# Patient Record
Sex: Female | Born: 2000 | Race: Black or African American | Hispanic: No | Marital: Single | Smoking: Never smoker
Health system: Southern US, Community
[De-identification: ages and names within clinical notes are randomized; demographics above are authoritative.]

## PROBLEM LIST (undated history)

## (undated) DIAGNOSIS — L409 Psoriasis, unspecified: Secondary | ICD-10-CM

---

## 2021-01-26 ENCOUNTER — Encounter (HOSPITAL_COMMUNITY): Payer: Self-pay

## 2021-01-26 ENCOUNTER — Other Ambulatory Visit: Payer: Self-pay

## 2021-01-26 ENCOUNTER — Emergency Department (HOSPITAL_COMMUNITY): Payer: Federal, State, Local not specified - PPO

## 2021-01-26 ENCOUNTER — Emergency Department (HOSPITAL_COMMUNITY)
Admission: EM | Admit: 2021-01-26 | Discharge: 2021-01-26 | Disposition: A | Discharge status: Home / Self Care | Payer: Federal, State, Local not specified - PPO | Attending: Emergency Medicine | Admitting: Emergency Medicine

## 2021-01-26 DIAGNOSIS — M791 Myalgia, unspecified site: Secondary | ICD-10-CM | POA: Diagnosis not present

## 2021-01-26 DIAGNOSIS — R569 Unspecified convulsions: Secondary | ICD-10-CM | POA: Diagnosis present

## 2021-01-26 DIAGNOSIS — R202 Paresthesia of skin: Secondary | ICD-10-CM | POA: Diagnosis not present

## 2021-01-26 DIAGNOSIS — R42 Dizziness and giddiness: Secondary | ICD-10-CM | POA: Diagnosis not present

## 2021-01-26 DIAGNOSIS — R519 Headache, unspecified: Secondary | ICD-10-CM | POA: Insufficient documentation

## 2021-01-26 HISTORY — DX: Psoriasis, unspecified: L40.9

## 2021-01-26 LAB — CBC WITH DIFFERENTIAL/PLATELET
Abs Immature Granulocytes: 0.04 10*3/uL (ref 0.00–0.07)
Basophils Absolute: 0.1 10*3/uL (ref 0.0–0.1)
Basophils Relative: 1 %
Eosinophils Absolute: 0.2 10*3/uL (ref 0.0–0.5)
Eosinophils Relative: 2 %
HCT: 38 % (ref 36.0–46.0)
Hemoglobin: 12.4 g/dL (ref 12.0–15.0)
Immature Granulocytes: 0 %
Lymphocytes Relative: 27 %
Lymphs Abs: 2.9 10*3/uL (ref 0.7–4.0)
MCH: 31.3 pg (ref 26.0–34.0)
MCHC: 32.6 g/dL (ref 30.0–36.0)
MCV: 96 fL (ref 80.0–100.0)
Monocytes Absolute: 1.2 10*3/uL — ABNORMAL HIGH (ref 0.1–1.0)
Monocytes Relative: 11 %
Neutro Abs: 6.6 10*3/uL (ref 1.7–7.7)
Neutrophils Relative %: 59 %
Platelets: 349 10*3/uL (ref 150–400)
RBC: 3.96 MIL/uL (ref 3.87–5.11)
RDW: 11.9 % (ref 11.5–15.5)
WBC: 10.9 10*3/uL — ABNORMAL HIGH (ref 4.0–10.5)
nRBC: 0 % (ref 0.0–0.2)

## 2021-01-26 LAB — COMPREHENSIVE METABOLIC PANEL
ALT: 6 U/L (ref 0–44)
AST: 16 U/L (ref 15–41)
Albumin: 4.1 g/dL (ref 3.5–5.0)
Alkaline Phosphatase: 34 U/L — ABNORMAL LOW (ref 38–126)
Anion gap: 8 (ref 5–15)
BUN: 10 mg/dL (ref 6–20)
CO2: 24 mmol/L (ref 22–32)
Calcium: 9.1 mg/dL (ref 8.9–10.3)
Chloride: 107 mmol/L (ref 98–111)
Creatinine, Ser: 0.79 mg/dL (ref 0.44–1.00)
GFR, Estimated: >60 mL/min (ref >60)
Glucose, Bld: 99 mg/dL (ref 70–99)
Potassium: 3.9 mmol/L (ref 3.5–5.1)
Sodium: 139 mmol/L (ref 135–145)
Total Bilirubin: 0.3 mg/dL (ref 0.3–1.2)
Total Protein: 7.4 g/dL (ref 6.5–8.1)

## 2021-01-26 LAB — RAPID URINE DRUG SCREEN, HOSP PERFORMED
Amphetamines: NOT DETECTED
Barbiturates: NOT DETECTED
Benzodiazepines: NOT DETECTED
Cocaine: NOT DETECTED
Opiates: NOT DETECTED
Tetrahydrocannabinol: NOT DETECTED

## 2021-01-26 LAB — I-STAT BETA HCG BLOOD, ED (MC, WL, AP ONLY): I-stat hCG, quantitative: <5 m[IU]/mL (ref <5)

## 2021-01-26 LAB — CBG MONITORING, ED: Glucose-Capillary: 112 mg/dL — ABNORMAL HIGH (ref 70–99)

## 2021-01-26 NOTE — Discharge Instructions (Addendum)
Do NOT drive until cleared by neurology. Do not do work on ladders, swim in a pool or soak in a tub or participate in activities that could endanger yourself or others. Follow up with neurology, a referral has been made to [facility]. Return to the ER for worsening or concerning symptoms.

## 2021-01-26 NOTE — ED Triage Notes (Signed)
Pt states she's been having seizures x 2 weeks- states "maybe 4 total". Pt states she thinks she's had seizures in the past, but never seen MD about issue. Pt states she hit her head during episode 2 wks ago, foaming at the mouth, seizures last 1-2 minutes. Does not take keppra or any seizure meds. Pt a& o x4

## 2021-01-26 NOTE — ED Provider Notes (Signed)
[facility]-EMERGENCY DEPT Provider Note   CSN: [id number] Arrival date & time: 01/26/21  1656     History Chief Complaint  Patient presents with  . Seizures    Sara Ford is a 20 y.o. female.  20 year old female present with complaint of possible seizures. States 2 weeks ago, trying to get the covers out from under her in bed in her dorm, fell out of the bed, approximately 4 feet high, hit occipital head on an outlet box protruding from the wall, + LOC, reports foam on her mouth when she woke up, no loss of bowel or bladder control.. Event not witnessed by anyone else. Guesses occurred around 4pm and lasted a few minutes. Headache and body aches afterwards, is in [employer] and participated in PT and assumed body aches were normal.  Yesterday, blacked out and was told that she was shaking and foaming at the mouth for 2 minutes, then same event happened again 20 minutes later. Reports feeling dizzy with right side body tingling (diffuse). Ongoing headaches. Events yesterday happened while sitting on a couch, did not fall/no further injuries. No loss of bowel or bladder control. Reports bit tongue yesterday.  History of erythrodermic psoriasis, does not require medications, has IBS (medications made skin condition worse so stopped all meds).  Maternal great grandmother with seizure history.  Denies drug or alcohol use.         Past Medical History:  Diagnosis Date  . Psoriasis     There are no problems to display for this patient.   History reviewed. No pertinent surgical history.   OB History   No obstetric history on file.     No family history on file.  Social History   Tobacco Use  . Smoking status: Never Smoker  . Smokeless tobacco: Never Used  Substance Use Topics  . Alcohol use: Not Currently  . Drug use: Not Currently    Home Medications Prior to Admission medications   Not on File    Allergies    Penicillins  Review of Systems    Review of Systems  Constitutional: Negative for chills and fever.  Eyes: Negative for visual disturbance.  Respiratory: Negative for cough.   Cardiovascular: Negative for chest pain.  Gastrointestinal: Negative for abdominal distention, nausea and vomiting.  Musculoskeletal: Negative for back pain and neck pain.  Skin: Negative for rash and wound.  Allergic/Immunologic: Negative for immunocompromised state.  Neurological: Positive for headaches. Negative for weakness.  Psychiatric/Behavioral: Negative for confusion.  All other systems reviewed and are negative.   Physical Exam Updated Vital Signs BP 127/70   Pulse 82   Temp 98.5 F (36.9 C) (Oral)   Resp 16   Ht 5\' 1"  (1.549 m)   Wt 61.2 kg   LMP 01/12/2021   SpO2 100%   BMI 25.51 kg/m   Physical Exam Vitals and nursing note reviewed.  Constitutional:      General: She is not in acute distress.    Appearance: She is well-developed and well-nourished. She is not diaphoretic.  HENT:     Head: Normocephalic and atraumatic.     Mouth/Throat:     Mouth: Mucous membranes are moist.  Eyes:     Extraocular Movements: Extraocular movements intact.     Pupils: Pupils are equal, round, and reactive to light.  Cardiovascular:     Rate and Rhythm: Normal rate and regular rhythm.     Pulses: Normal pulses.     Heart  sounds: Normal heart sounds.  Pulmonary:     Effort: Pulmonary effort is normal.     Breath sounds: Normal breath sounds.  Abdominal:     Palpations: Abdomen is soft.     Tenderness: There is no abdominal tenderness.  Musculoskeletal:     Cervical back: Normal range of motion and neck supple. No tenderness.  Skin:    General: Skin is warm and dry.     Findings: No erythema or rash.  Neurological:     Mental Status: She is alert and oriented to person, place, and time.     Cranial Nerves: No cranial nerve deficit.     Sensory: No sensory deficit.     Motor: No weakness.     Gait: Gait normal.  Psychiatric:         Mood and Affect: Mood and affect normal.        Behavior: Behavior normal.     ED Results / Procedures / Treatments   Labs (all labs ordered are listed, but only abnormal results are displayed) Labs Reviewed  COMPREHENSIVE METABOLIC PANEL - Abnormal; Notable for the following components:      Result Value   Alkaline Phosphatase 34 (*)    All other components within normal limits  CBC WITH DIFFERENTIAL/PLATELET - Abnormal; Notable for the following components:   WBC 10.9 (*)    Monocytes Absolute 1.2 (*)    All other components within normal limits  CBG MONITORING, ED - Abnormal; Notable for the following components:   Glucose-Capillary 112 (*)    All other components within normal limits  RAPID URINE DRUG SCREEN, HOSP PERFORMED  I-STAT BETA HCG BLOOD, ED ([facility], [facility], [facility] ONLY)    EKG None  Radiology CT Head Wo Contrast  Result Date: 01/26/2021 CLINICAL DATA:  Seizure, acute, history of trauma EXAM: CT HEAD WITHOUT CONTRAST TECHNIQUE: Contiguous axial images were obtained from the base of the skull through the vertex without intravenous contrast. COMPARISON:  None. FINDINGS: Brain: No evidence of acute infarction, hemorrhage, hydrocephalus, extra-axial collection or mass lesion/mass effect. Vascular: Negative for hyperdense vessel Skull: Negative Sinuses/Orbits: Negative Other: None IMPRESSION: Negative CT head Electronically Signed   By: Marlan Palau M.D.   On: 01/26/2021 20:40    Procedures Procedures   Medications Ordered in ED Medications - No data to display  ED Course  I have reviewed the triage vital signs and the nursing notes.  Pertinent labs & imaging results that were available during my care of the patient were reviewed by me and considered in my medical decision making (see chart for details).  Clinical Course as of 01/26/21 2209  Mon Jan 26, 2021  158 20 year old female presents for evaluation after possible seizure as above.  Exam is unremarkable.  CT  head is normal, labs without significant findings including CBC, CMP.  hCG is negative. Patient is referred to neurology for follow-up, advised not to drive, swim in a pool or operate machinery produce pain activities that could potentially put her or others in danger. [LM]    Clinical Course User Index [LM] Alden Hipp   MDM Rules/Calculators/A&P                          Final Clinical Impression(s) / ED Diagnoses Final diagnoses:  Seizure-like activity [facility])    Rx / DC Orders ED Discharge Orders         Ordered    Ambulatory referral to  Neurology       Comments: An appointment is requested in approximately: 1 week   01/26/21 2203           Jeannie Fend, PA-C 01/26/21 2209    Linwood Dibbles, MD 01/27/21 [phone]

## 2021-03-02 ENCOUNTER — Ambulatory Visit: Payer: Federal, State, Local not specified - PPO | Admitting: Diagnostic Neuroimaging

## 2021-05-26 ENCOUNTER — Ambulatory Visit (HOSPITAL_COMMUNITY)
Admission: EM | Admit: 2021-05-26 | Discharge: 2021-05-26 | Disposition: A | Discharge status: Home / Self Care | Payer: Federal, State, Local not specified - PPO | Attending: Physician Assistant | Admitting: Physician Assistant

## 2021-05-26 ENCOUNTER — Encounter (HOSPITAL_COMMUNITY): Payer: Self-pay | Admitting: Emergency Medicine

## 2021-05-26 ENCOUNTER — Other Ambulatory Visit: Payer: Self-pay

## 2021-05-26 DIAGNOSIS — R110 Nausea: Secondary | ICD-10-CM

## 2021-05-26 DIAGNOSIS — R1030 Lower abdominal pain, unspecified: Secondary | ICD-10-CM | POA: Diagnosis present

## 2021-05-26 DIAGNOSIS — Z113 Encounter for screening for infections with a predominantly sexual mode of transmission: Secondary | ICD-10-CM | POA: Insufficient documentation

## 2021-05-26 LAB — POCT URINALYSIS DIPSTICK, ED / UC
Bilirubin Urine: NEGATIVE
Glucose, UA: NEGATIVE mg/dL
Hgb urine dipstick: NEGATIVE
Ketones, ur: NEGATIVE mg/dL
Leukocytes,Ua: NEGATIVE
Nitrite: NEGATIVE
Protein, ur: NEGATIVE mg/dL
Specific Gravity, Urine: 1.025 (ref 1.005–1.030)
Urobilinogen, UA: 0.2 mg/dL (ref 0.0–1.0)
pH: 5.5 (ref 5.0–8.0)

## 2021-05-26 LAB — POC URINE PREG, ED: Preg Test, Ur: NEGATIVE

## 2021-05-26 MED ORDER — ONDANSETRON 4 MG PO TBDP
ORAL_TABLET | ORAL | Status: AC
  Filled 2021-05-26: qty 1

## 2021-05-26 MED ORDER — FAMOTIDINE 40 MG PO TABS: 40.0000 mg | ORAL_TABLET | Freq: Every day | ORAL | 0 refills | Status: DC

## 2021-05-26 MED ORDER — ONDANSETRON 4 MG PO TBDP
4.0000 mg | ORAL_TABLET | Freq: Once | ORAL | Status: AC
  Administered 2021-05-26: 4 mg via ORAL

## 2021-05-26 NOTE — ED Triage Notes (Signed)
Lower abdominal pain and nausea for 3 weeks. History of irregualr menstruals, is sexually active without use of contraception. Denies dysuria / vaginal DC

## 2021-05-26 NOTE — ED Provider Notes (Signed)
[facility]    CSN: [id number] Arrival date & time: 05/26/21  1557      History   Chief Complaint Chief Complaint  Patient presents with   Abdominal Pain   Nausea    HPI Sara Ford is a 20 y.o. female.   Patient presents today with a several week history of intermittent abdominal pain and nausea.  She reports pain is generally localized to her lower abdomen.  Pain is currently rated 3 on a 0-10 pain scale but at its worst is rated an 8, described as cramping, localized to lower abdomen without radiation, no aggravating relieving factors identified.  She has seen a GI specialist in the past and was diagnosed with IBS but reports symptoms resolved several years ago and she has not had any recurrent problems with this condition.  Denies previous abdominal surgery.  She denies any pelvic pain, vaginal discharge, urinary symptoms, fever, vomiting, melena, hematochezia.  She denies any changes to diet or routine.  She is unsure if symptoms are related to oral intake and often has bouts of nausea without identifiable trigger.  She denies history of GERD and has not tried any acid reducing medications.   Past Medical History:  Diagnosis Date   Psoriasis     There are no problems to display for this patient.   History reviewed. No pertinent surgical history.  OB History   No obstetric history on file.      Home Medications    Prior to Admission medications   Medication Sig Start Date End Date Taking? Authorizing Provider  famotidine (PEPCID) 40 MG tablet Take 1 tablet (40 mg total) by mouth daily. 05/26/21  Yes Pratham Cassatt, Noberto Retort, PA-C    Family History History reviewed. No pertinent family history.  Social History Social History   Tobacco Use   Smoking status: Never   Smokeless tobacco: Never  Substance Use Topics   Alcohol use: Not Currently   Drug use: Not Currently     Allergies   Contrast media [iodinated diagnostic agents] and  Penicillins   Review of Systems Review of Systems  Constitutional:  Negative for activity change, appetite change, fatigue and fever.  Respiratory:  Negative for cough and shortness of breath.   Cardiovascular:  Negative for chest pain.  Gastrointestinal:  Positive for abdominal pain and nausea. Negative for diarrhea and vomiting.  Genitourinary:  Negative for dysuria, frequency, urgency, vaginal bleeding, vaginal discharge and vaginal pain.  Musculoskeletal:  Negative for arthralgias and myalgias.  Neurological:  Negative for dizziness, light-headedness and headaches.    Physical Exam Triage Vital Signs ED Triage Vitals  Enc Vitals Group     BP 05/26/21 1705 131/78     Pulse Rate 05/26/21 1705 91     Resp 05/26/21 1705 16     Temp 05/26/21 1705 99 F (37.2 C)     Temp Source 05/26/21 1705 Oral     SpO2 05/26/21 1705 100 %     Weight --      Height --      Head Circumference --      Peak Flow --      Pain Score 05/26/21 1707 3     Pain Loc --      Pain Edu? --      Excl. in GC? --    No data found.  Updated Vital Signs BP 131/78   Pulse 91   Temp 99 F (37.2 C) (Oral)   Resp 16  SpO2 100%   Visual Acuity Right Eye Distance:   Left Eye Distance:   Bilateral Distance:    Right Eye Near:   Left Eye Near:    Bilateral Near:     Physical Exam Vitals reviewed.  Constitutional:      General: She is awake. She is not in acute distress.    Appearance: Normal appearance. She is normal weight. She is not ill-appearing.     Comments: Very pleasant female appears stated age in no acute distress sitting comfortably on exam table  HENT:     Head: Normocephalic and atraumatic.     Mouth/Throat:     Mouth: Mucous membranes are moist.  Cardiovascular:     Rate and Rhythm: Normal rate and regular rhythm.     Heart sounds: Normal heart sounds, S1 normal and S2 normal. No murmur heard. Pulmonary:     Effort: Pulmonary effort is normal.     Breath sounds: Normal breath  sounds. No wheezing, rhonchi or rales.     Comments: Clear to auscultation bilaterally Abdominal:     General: Bowel sounds are normal.     Palpations: Abdomen is soft.     Tenderness: There is abdominal tenderness in the right lower quadrant, suprapubic area and left lower quadrant. There is no right CVA tenderness, left CVA tenderness, guarding or rebound. Negative signs include Rovsing's sign, McBurney's sign and psoas sign.     Comments: Mild tenderness palpation throughout lower abdomen.  No CVA tenderness.  No evidence of acute abdomen on physical exam.  Genitourinary:    Comments: Exam deferred Psychiatric:        Behavior: Behavior is cooperative.     UC Treatments / Results  Labs (all labs ordered are listed, but only abnormal results are displayed) Labs Reviewed  POC URINE PREG, ED  POCT URINALYSIS DIPSTICK, ED / UC  CERVICOVAGINAL ANCILLARY ONLY    EKG   Radiology No results found.  Procedures Procedures (including critical care time)  Medications Ordered in UC Medications  ondansetron (ZOFRAN-ODT) disintegrating tablet 4 mg (4 mg Oral Given 05/26/21 1742)    Initial Impression / Assessment and Plan / UC Course  I have reviewed the triage vital signs and the nursing notes.  Pertinent labs & imaging results that were available during my care of the patient were reviewed by me and considered in my medical decision making (see chart for details).      UA negative in clinic.  Urine pregnancy test was negative.  Vital signs and physical exam reassuring today; no negation for emergent evaluation or imaging.  Patient was given Zofran in clinic with minimal improvement of symptoms.  Offered prescription for different nausea medication such as Phenergan the patient declined this.  We will start Pepcid in the hopes this to provide some relief of symptoms.  She was encouraged to eat a bland diet and drink plenty of fluid.  Discussed that if symptoms persist or worsen she  would need to go to the emergency room for imaging as this is not something that is available in urgent care.  She was given contact information for GI specialist and instructed to follow-up if symptoms persist but do not worsen.  Discussed alarm symptoms that warrant emergent evaluation.  Strict return precautions given to which patient expressed understanding.  Final Clinical Impressions(s) / UC Diagnoses   Final diagnoses:  Lower abdominal pain  Nausea  Routine screening for STI (sexually transmitted infection)     Discharge Instructions  Make sure you are drinking plenty of fluid and eat a very bland diet.  Begin Pepcid at night to help coat your stomach and decrease acid.  If you develop any worsening symptoms including severe abdominal pain, persistent nausea/vomiting, fever, blood in your stool you need to be reevaluated immediately.  If your abdominal pain worsens you need to go to the emergency room as we are unable to obtain imaging in urgent care.  Follow-up with gastroenterology if symptoms do not improve.     ED Prescriptions     Medication Sig Dispense Auth. Provider   famotidine (PEPCID) 40 MG tablet Take 1 tablet (40 mg total) by mouth daily. 30 tablet Hali Balgobin, Noberto Retort, PA-C      PDMP not reviewed this encounter.   Jeani Hawking, PA-C 05/26/21 1757

## 2021-05-26 NOTE — Discharge Instructions (Addendum)
Make sure you are drinking plenty of fluid and eat a very bland diet.  Begin Pepcid at night to help coat your stomach and decrease acid.  If you develop any worsening symptoms including severe abdominal pain, persistent nausea/vomiting, fever, blood in your stool you need to be reevaluated immediately.  If your abdominal pain worsens you need to go to the emergency room as we are unable to obtain imaging in urgent care.  Follow-up with gastroenterology if symptoms do not improve.

## 2021-05-27 ENCOUNTER — Telehealth (HOSPITAL_COMMUNITY): Payer: Self-pay | Admitting: Emergency Medicine

## 2021-05-27 LAB — CERVICOVAGINAL ANCILLARY ONLY
Bacterial Vaginitis (gardnerella): POSITIVE — AB
Candida Glabrata: NEGATIVE
Candida Vaginitis: NEGATIVE
Chlamydia: NEGATIVE
Comment: NEGATIVE
Comment: NEGATIVE
Comment: NEGATIVE
Comment: NEGATIVE
Comment: NEGATIVE
Comment: NORMAL
Neisseria Gonorrhea: NEGATIVE
Trichomonas: NEGATIVE

## 2021-05-27 MED ORDER — METRONIDAZOLE 500 MG PO TABS: 500.0000 mg | ORAL_TABLET | Freq: Two times a day (BID) | ORAL | 0 refills | Status: DC

## 2021-08-18 ENCOUNTER — Encounter (HOSPITAL_COMMUNITY): Payer: Self-pay

## 2021-08-18 ENCOUNTER — Ambulatory Visit (HOSPITAL_COMMUNITY)
Admission: EM | Admit: 2021-08-18 | Discharge: 2021-08-18 | Disposition: A | Discharge status: Home / Self Care | Payer: BC Managed Care – PPO | Attending: Student | Admitting: Student

## 2021-08-18 ENCOUNTER — Other Ambulatory Visit: Payer: Self-pay

## 2021-08-18 DIAGNOSIS — Z3200 Encounter for pregnancy test, result unknown: Secondary | ICD-10-CM | POA: Diagnosis present

## 2021-08-18 LAB — HCG, QUANTITATIVE, PREGNANCY: hCG, Beta Chain, Quant, S: 1 m[IU]/mL (ref <5)

## 2021-08-18 NOTE — ED Provider Notes (Signed)
[facility]    CSN: [id number] Arrival date & time: 08/18/21  1231      History   Chief Complaint Chief Complaint  Patient presents with   Possible Pregnancy    HPI Tal Neer is a 20 y.o. female presenting with concern for pregnancy.  Medical history noncontributory.  Patient states she is sexually active.  Endorses nausea without vomiting, breast tenderness x3 weeks following last period. Denies vaginal or urinary symptoms.   HPI  Past Medical History:  Diagnosis Date   Psoriasis     There are no problems to display for this patient.   History reviewed. No pertinent surgical history.  OB History   No obstetric history on file.      Home Medications    Prior to Admission medications   Medication Sig Start Date End Date Taking? Authorizing Provider  famotidine (PEPCID) 40 MG tablet Take 1 tablet (40 mg total) by mouth daily. 05/26/21   Raspet, Noberto Retort, PA-C  metroNIDAZOLE (FLAGYL) 500 MG tablet Take 1 tablet (500 mg total) by mouth 2 (two) times daily. 05/27/21   Lamptey, Britta Mccreedy, MD    Family History Family History  Problem Relation Age of Onset   Colon cancer Mother     Social History Social History   Tobacco Use   Smoking status: Never   Smokeless tobacco: Never  Substance Use Topics   Alcohol use: Not Currently   Drug use: Not Currently     Allergies   Contrast media [iodinated diagnostic agents] and Penicillins   Review of Systems Review of Systems  All other systems reviewed and are negative.   Physical Exam Triage Vital Signs ED Triage Vitals  Enc Vitals Group     BP 08/18/21 1344 127/75     Pulse Rate 08/18/21 1344 90     Resp 08/18/21 1344 17     Temp 08/18/21 1344 98.9 F (37.2 C)     Temp Source 08/18/21 1344 Oral     SpO2 08/18/21 1344 96 %     Weight --      Height --      Head Circumference --      Peak Flow --      Pain Score 08/18/21 1341 0     Pain Loc --      Pain Edu? --      Excl. in GC? --     No data found.  Updated Vital Signs BP 127/75 (BP Location: Right Arm)   Pulse 90   Temp 98.9 F (37.2 C) (Oral)   Resp 17   LMP 07/28/2021 Comment: Pt has PCOS  SpO2 96%   Visual Acuity Right Eye Distance:   Left Eye Distance:   Bilateral Distance:    Right Eye Near:   Left Eye Near:    Bilateral Near:     Physical Exam Vitals reviewed.  Constitutional:      General: She is not in acute distress.    Appearance: Normal appearance. She is not ill-appearing or diaphoretic.  HENT:     Head: Normocephalic and atraumatic.  Cardiovascular:     Rate and Rhythm: Normal rate and regular rhythm.     Heart sounds: Normal heart sounds.  Pulmonary:     Effort: Pulmonary effort is normal.     Breath sounds: Normal breath sounds.  Skin:    General: Skin is warm.  Neurological:     General: No focal deficit present.  Mental Status: She is alert and oriented to person, place, and time.  Psychiatric:        Mood and Affect: Mood normal.        Behavior: Behavior normal.        Thought Content: Thought content normal.        Judgment: Judgment normal.     UC Treatments / Results  Labs (all labs ordered are listed, but only abnormal results are displayed) Labs Reviewed  HCG, QUANTITATIVE, PREGNANCY    EKG   Radiology No results found.  Procedures Procedures (including critical care time)  Medications Ordered in UC Medications - No data to display  Initial Impression / Assessment and Plan / UC Course  I have reviewed the triage vital signs and the nursing notes.  Pertinent labs & imaging results that were available during my care of the patient were reviewed by me and considered in my medical decision making (see chart for details).     This patient is a very pleasant 20 y.o. year old female presenting with concern for pregnancy- 3 positive and 3 negative home pregnancy tests. Quant Hcg sent. ED return precautions discussed. Patient verbalizes understanding  and agreement.  .   Final Clinical Impressions(s) / UC Diagnoses   Final diagnoses:  Encounter for pregnancy test, result unknown     Discharge Instructions      -We'll call within about 1 day if positive. Results will also go to your mychart.     ED Prescriptions   None    PDMP not reviewed this encounter.   Rhys Martini, PA-C 08/18/21 [phone]

## 2021-08-18 NOTE — ED Triage Notes (Signed)
Pt is here wanting a blood pregnancy test after having 3 positive test done today & 3 negative test done today as well. Pt states she had a menstrual 3 weeks ago.

## 2021-08-18 NOTE — Discharge Instructions (Addendum)
-  We'll call within about 1 day if positive. Results will also go to your mychart.

## 2021-11-09 ENCOUNTER — Emergency Department (HOSPITAL_COMMUNITY)
Admission: EM | Admit: 2021-11-09 | Discharge: 2021-11-10 | Disposition: A | Discharge status: Home / Self Care | Payer: BC Managed Care – PPO | Attending: Emergency Medicine | Admitting: Emergency Medicine

## 2021-11-09 ENCOUNTER — Other Ambulatory Visit: Payer: Self-pay

## 2021-11-09 ENCOUNTER — Encounter (HOSPITAL_COMMUNITY): Payer: Self-pay | Admitting: Emergency Medicine

## 2021-11-09 ENCOUNTER — Emergency Department (HOSPITAL_COMMUNITY): Payer: BC Managed Care – PPO

## 2021-11-09 DIAGNOSIS — N9489 Other specified conditions associated with female genital organs and menstrual cycle: Secondary | ICD-10-CM | POA: Insufficient documentation

## 2021-11-09 DIAGNOSIS — M79674 Pain in right toe(s): Secondary | ICD-10-CM | POA: Diagnosis not present

## 2021-11-09 DIAGNOSIS — R1012 Left upper quadrant pain: Secondary | ICD-10-CM | POA: Diagnosis not present

## 2021-11-09 DIAGNOSIS — R109 Unspecified abdominal pain: Secondary | ICD-10-CM | POA: Diagnosis present

## 2021-11-09 LAB — URINALYSIS, ROUTINE W REFLEX MICROSCOPIC
Bilirubin Urine: NEGATIVE
Glucose, UA: NEGATIVE mg/dL
Hgb urine dipstick: NEGATIVE
Ketones, ur: NEGATIVE mg/dL
Leukocytes,Ua: NEGATIVE
Nitrite: NEGATIVE
Protein, ur: NEGATIVE mg/dL
Specific Gravity, Urine: 1.025 (ref 1.005–1.030)
pH: 5.5 (ref 5.0–8.0)

## 2021-11-09 LAB — CBC WITH DIFFERENTIAL/PLATELET
Abs Immature Granulocytes: 0.02 10*3/uL (ref 0.00–0.07)
Basophils Absolute: 0 10*3/uL (ref 0.0–0.1)
Basophils Relative: 1 %
Eosinophils Absolute: 0.2 10*3/uL (ref 0.0–0.5)
Eosinophils Relative: 3 %
HCT: 39.2 % (ref 36.0–46.0)
Hemoglobin: 12.8 g/dL (ref 12.0–15.0)
Immature Granulocytes: 0 %
Lymphocytes Relative: 32 %
Lymphs Abs: 2.5 10*3/uL (ref 0.7–4.0)
MCH: 30.8 pg (ref 26.0–34.0)
MCHC: 32.7 g/dL (ref 30.0–36.0)
MCV: 94.5 fL (ref 80.0–100.0)
Monocytes Absolute: 1 10*3/uL (ref 0.1–1.0)
Monocytes Relative: 12 %
Neutro Abs: 4.2 10*3/uL (ref 1.7–7.7)
Neutrophils Relative %: 52 %
Platelets: 317 10*3/uL (ref 150–400)
RBC: 4.15 MIL/uL (ref 3.87–5.11)
RDW: 11.8 % (ref 11.5–15.5)
WBC: 8 10*3/uL (ref 4.0–10.5)
nRBC: 0 % (ref 0.0–0.2)

## 2021-11-09 LAB — COMPREHENSIVE METABOLIC PANEL
ALT: 10 U/L (ref 0–44)
AST: 17 U/L (ref 15–41)
Albumin: 3.8 g/dL (ref 3.5–5.0)
Alkaline Phosphatase: 64 U/L (ref 38–126)
Anion gap: 6 (ref 5–15)
BUN: 11 mg/dL (ref 6–20)
CO2: 26 mmol/L (ref 22–32)
Calcium: 9.2 mg/dL (ref 8.9–10.3)
Chloride: 107 mmol/L (ref 98–111)
Creatinine, Ser: 0.8 mg/dL (ref 0.44–1.00)
GFR, Estimated: >60 mL/min (ref >60)
Glucose, Bld: 80 mg/dL (ref 70–99)
Potassium: 3.9 mmol/L (ref 3.5–5.1)
Sodium: 139 mmol/L (ref 135–145)
Total Bilirubin: 0.2 mg/dL — ABNORMAL LOW (ref 0.3–1.2)
Total Protein: 7 g/dL (ref 6.5–8.1)

## 2021-11-09 LAB — I-STAT BETA HCG BLOOD, ED (MC, WL, AP ONLY): I-stat hCG, quantitative: <5 m[IU]/mL (ref <5)

## 2021-11-09 LAB — LIPASE, BLOOD: Lipase: 37 U/L (ref 11–51)

## 2021-11-09 NOTE — ED Triage Notes (Signed)
Patient with abdominal pain for almost 3 months.  With nausea and vomiting.  Patient also states that she has right 4th toe pain, states no injury but does have a bruise on it.

## 2021-11-09 NOTE — ED Provider Notes (Signed)
Emergency Medicine Provider Triage Evaluation Note  Sara Ford , a 20 y.o. female  was evaluated in triage.  Pt complains of abdominal pain has been ongoing for months.  Was worse over the last 24 hours prompted her arrival.  Reports associated intermittent fevers, shortness of breath, nausea and vomiting, irregular periods.  Took a pregnancy test which is negative.  Also complains of right fourth toe pain.  No injury.  Review of Systems  Positive:  Negative: See above   Physical Exam  BP 127/85 (BP Location: Right Arm)   Pulse 88   Temp 99 F (37.2 C) (Oral)   Resp 16   SpO2 99%  Gen:   Awake, no distress   Resp:  Normal effort  MSK:   Moves extremities without difficulty  Other:  Generalized abdominal tenderness  Medical Decision Making  Medically screening exam initiated at 7:15 PM.  Appropriate orders placed.  Sara Ford was informed that the remainder of the evaluation will be completed by another provider, this initial triage assessment does not replace that evaluation, and the importance of remaining in the ED until their evaluation is complete.     Sara Ford, [place] 11/09/21 1915    Ford, Sara Brim, MD 11/09/21 [phone]

## 2021-11-10 DIAGNOSIS — R1012 Left upper quadrant pain: Secondary | ICD-10-CM | POA: Diagnosis not present

## 2021-11-10 MED ORDER — KETOROLAC TROMETHAMINE 30 MG/ML IJ SOLN: 30.0000 mg | Freq: Once | INTRAMUSCULAR | Status: DC

## 2021-11-10 MED ORDER — KETOROLAC TROMETHAMINE 60 MG/2ML IM SOLN
30.0000 mg | Freq: Once | INTRAMUSCULAR | Status: AC
  Administered 2021-11-10: 30 mg via INTRAMUSCULAR

## 2021-11-10 MED ORDER — KETOROLAC TROMETHAMINE 60 MG/2ML IM SOLN
60.0000 mg | Freq: Once | INTRAMUSCULAR | Status: DC
  Filled 2021-11-10: qty 2

## 2021-11-10 MED ORDER — OMEPRAZOLE 20 MG PO CPDR: 20.0000 mg | DELAYED_RELEASE_CAPSULE | Freq: Every day | ORAL | 0 refills | Status: DC

## 2021-11-10 NOTE — ED Notes (Signed)
Discharge instructions and prescription reviewed and explained, pt verbalized understanding. °

## 2021-11-10 NOTE — ED Provider Notes (Signed)
[facility] EMERGENCY DEPARTMENT Provider Note   CSN: [id number] Arrival date & time: 11/09/21  1730     History Chief Complaint  Patient presents with   Abdominal Pain   Toe Pain    Sara Ford is a 20 y.o. female.  HPI     This is a 20 year old female with a history of psoriasis who presents with abdominal pain.  Patient reports a several month history of waxing and waning abdominal pain.  She describes pain in the left upper quadrant that comes and goes.  Nothing seems to make it better or worse.  She states over the last 24 to 48 hours it has acutely worsened.  She has taken ibuprofen with minimal relief.  Denies nausea, vomiting, change in bowel movements.  She reports irregular periods.  No vaginal discharge or urinary symptoms.  She reports 1 sexual partner and does not have any concerns for STDs.  Currently she rates her pain at 6 out of 10.  On review of systems, patient had multiple ongoing and subacute complaints including shortness of breath, dizziness, waxing and waning fevers.  She has not had primary physician and recently moved here.  Chart reviewed.  Visit in June for bacterial vaginitis and abdominal discomfort.  Past Medical History:  Diagnosis Date   Psoriasis     There are no problems to display for this patient.   History reviewed. No pertinent surgical history.   OB History   No obstetric history on file.     Family History  Problem Relation Age of Onset   Colon cancer Mother     Social History   Tobacco Use   Smoking status: Never   Smokeless tobacco: Never  Substance Use Topics   Alcohol use: Not Currently   Drug use: Not Currently    Home Medications Prior to Admission medications   Medication Sig Start Date End Date Taking? Authorizing Provider  omeprazole (PRILOSEC) 20 MG capsule Take 1 capsule (20 mg total) by mouth daily. 11/10/21  Yes Brailon Don, Mayer Masker, MD  famotidine (PEPCID) 40 MG tablet Take 1  tablet (40 mg total) by mouth daily. 05/26/21   Raspet, Noberto Retort, PA-C  metroNIDAZOLE (FLAGYL) 500 MG tablet Take 1 tablet (500 mg total) by mouth 2 (two) times daily. 05/27/21   Merrilee Jansky, MD    Allergies    Contrast media [iodinated diagnostic agents] and Penicillins  Review of Systems   Review of Systems  Constitutional:  Positive for chills.  Respiratory:  Positive for shortness of breath. Negative for cough.   Cardiovascular:  Negative for chest pain.  Gastrointestinal:  Positive for abdominal pain, diarrhea, nausea and vomiting.  Genitourinary:  Negative for dysuria and vaginal discharge.  Skin:  Positive for rash.  All other systems reviewed and are negative.  Physical Exam Updated Vital Signs BP 113/81   Pulse 75   Temp 99 F (37.2 C) (Oral)   Resp 17   LMP 08/04/2021 (Exact Date)   SpO2 100%   Physical Exam Vitals and nursing note reviewed.  Constitutional:      Appearance: She is well-developed.  HENT:     Head: Normocephalic and atraumatic.  Eyes:     Pupils: Pupils are equal, round, and reactive to light.  Cardiovascular:     Rate and Rhythm: Normal rate and regular rhythm.     Heart sounds: Normal heart sounds.  Pulmonary:     Effort: Pulmonary effort is normal. No respiratory distress.  Breath sounds: No wheezing.  Abdominal:     General: Bowel sounds are normal.     Palpations: Abdomen is soft.     Tenderness: There is abdominal tenderness in the left upper quadrant. There is no guarding or rebound.  Musculoskeletal:     Cervical back: Neck supple.  Skin:    General: Skin is warm and dry.     Comments: Extensive psoriatic plaques noted over the entire body  Neurological:     Mental Status: She is alert and oriented to person, place, and time.  Psychiatric:        Mood and Affect: Mood normal.    ED Results / Procedures / Treatments   Labs (all labs ordered are listed, but only abnormal results are displayed) Labs Reviewed   COMPREHENSIVE METABOLIC PANEL - Abnormal; Notable for the following components:      Result Value   Total Bilirubin 0.2 (*)    All other components within normal limits  CBC WITH DIFFERENTIAL/PLATELET  LIPASE, BLOOD  URINALYSIS, ROUTINE W REFLEX MICROSCOPIC  I-STAT BETA HCG BLOOD, ED ([facility], [facility], [facility] ONLY)    EKG None  Radiology CT ABDOMEN PELVIS WO CONTRAST  Result Date: 11/09/2021 CLINICAL DATA:  Abdominal pain, acute, nonlocalized abdominal pain. Nausea, vomiting. EXAM: CT ABDOMEN AND PELVIS WITHOUT CONTRAST TECHNIQUE: Multidetector CT imaging of the abdomen and pelvis was performed following the standard protocol without IV contrast. COMPARISON:  None. FINDINGS: Lower chest: Lung bases are clear. No effusions. Heart is normal size. Hepatobiliary: No focal hepatic abnormality. Gallbladder unremarkable. Pancreas: No focal abnormality or ductal dilatation. Spleen: No focal abnormality.  Normal size. Adrenals/Urinary Tract: No adrenal abnormality. No focal renal abnormality. No stones or hydronephrosis. Urinary bladder is unremarkable. Stomach/Bowel: Normal appendix. Stomach, large and small bowel grossly unremarkable. Vascular/Lymphatic: No evidence of aneurysm or adenopathy. Reproductive: Uterus and adnexa unremarkable.  No mass. Other: No free fluid or free air. Musculoskeletal: No acute bony abnormality. IMPRESSION: Normal appendix. No acute findings in the abdomen or pelvis. Electronically Signed   By: Charlett Nose M.D.   On: 11/09/2021 22:40   DG Toe 4th Right  Result Date: 11/09/2021 CLINICAL DATA:  Pain in the right fourth toe with no known injury. EXAM: RIGHT FOURTH TOE COMPARISON:  None. FINDINGS: There is no evidence of fracture or dislocation. There is no evidence of arthropathy or other focal bone abnormality. Soft tissues are unremarkable. IMPRESSION: Negative. Electronically Signed   By: Almira Bar M.D.   On: 11/09/2021 19:55    Procedures Procedures   Medications Ordered  in ED Medications  ketorolac (TORADOL) injection 60 mg (has no administration in time range)    ED Course  I have reviewed the triage vital signs and the nursing notes.  Pertinent labs & imaging results that were available during my care of the patient were reviewed by me and considered in my medical decision making (see chart for details).    MDM Rules/Calculators/A&P                           Patient presents with multiple complaints.  Primary complaint today is worsening abdominal pain.  She is nontoxic and vital signs are reassuring.  Abdominal pain has been over the course of several months and waxes and wanes.  Is fairly nonspecific and then the left upper quadrant.  No signs or symptoms of SBO.  Denies vaginal discharge and is adamant that she is not at risk for  STDs.  Denies significant colonic symptoms such as diarrhea or vomiting.  Patient was medically screened in triage.  Lab work was obtained.  No significant metabolic derangements CBC, CMP, lipase are all negative.  Urinalysis and pregnancy test negative.  She has some slight left upper quadrant tenderness to palpation.  CT scan of drainage from provider in triage does not show any intra-abdominal process such as splenic abnormality, appendicitis.  Long discussion with the patient that her complaints appear subacute.  She would benefit from following up with primary physician.  She does not appear to have any acute emergent process at this time.  She was given a dose of Toradol.  Will d/c with omeprazole to see if this helps her symptoms although her symptoms are not textbook.  After history, exam, and medical workup I feel the patient has been appropriately medically screened and is safe for discharge home. Pertinent diagnoses were discussed with the patient. Patient was given return precautions.  Final Clinical Impression.  (s) / ED Diagnoses Final diagnoses:  Left upper quadrant abdominal pain    Rx / DC Orders ED Discharge  Orders          Ordered    omeprazole (PRILOSEC) 20 MG capsule  Daily        11/10/21 0134             Shon Baton, MD 11/10/21 [phone]

## 2021-11-10 NOTE — Discharge Instructions (Addendum)
You were seen today for abdominal pain.  The cause of your pain at this time is unknown.  However, your work-up is very reassuring including CT scan and lab work.  Given that it is in the left upper quadrant, common things could be reflux.  He may start omeprazole daily to see if this helps.  You need to establish primary care for your ongoing symptoms.

## 2021-11-23 ENCOUNTER — Encounter (HOSPITAL_COMMUNITY): Payer: Self-pay

## 2021-11-23 ENCOUNTER — Other Ambulatory Visit: Payer: Self-pay

## 2021-11-23 ENCOUNTER — Ambulatory Visit (HOSPITAL_COMMUNITY)
Admission: EM | Admit: 2021-11-23 | Discharge: 2021-11-23 | Disposition: A | Discharge status: Home / Self Care | Payer: BC Managed Care – PPO | Attending: Student | Admitting: Student

## 2021-11-23 DIAGNOSIS — N898 Other specified noninflammatory disorders of vagina: Secondary | ICD-10-CM | POA: Insufficient documentation

## 2021-11-23 DIAGNOSIS — Z3202 Encounter for pregnancy test, result negative: Secondary | ICD-10-CM | POA: Diagnosis present

## 2021-11-23 DIAGNOSIS — R1030 Lower abdominal pain, unspecified: Secondary | ICD-10-CM | POA: Diagnosis not present

## 2021-11-23 LAB — POCT URINALYSIS DIPSTICK, ED / UC
Bilirubin Urine: NEGATIVE
Glucose, UA: NEGATIVE mg/dL
Hgb urine dipstick: NEGATIVE
Ketones, ur: NEGATIVE mg/dL
Nitrite: NEGATIVE
Protein, ur: 30 mg/dL — AB
Specific Gravity, Urine: >=1.03 (ref 1.005–1.030)
Urobilinogen, UA: 0.2 mg/dL (ref 0.0–1.0)
pH: 6.5 (ref 5.0–8.0)

## 2021-11-23 LAB — HCG, QUANTITATIVE, PREGNANCY: hCG, Beta Chain, Quant, S: <1 m[IU]/mL (ref <5)

## 2021-11-23 LAB — POC URINE PREG, ED: Preg Test, Ur: NEGATIVE

## 2021-11-23 MED ORDER — OMEPRAZOLE 20 MG PO CPDR: 20.0000 mg | DELAYED_RELEASE_CAPSULE | Freq: Every day | ORAL | 0 refills | Status: DC

## 2021-11-23 NOTE — ED Provider Notes (Signed)
[facility]    CSN: [id number] Arrival date & time: 11/23/21  [id number]      History   Chief Complaint Chief Complaint  Patient presents with   Possible Pregnancy   Nausea    HPI Sara Ford is a 20 y.o. female presenting with nausea for about 2 weeks.  Medical history noncontributory.  Was last evaluated in the emergency department on 12/12 for the same symptoms, at that time she had a negative pregnancy test and a negative CT of the abdomen and was prescribed Prilosec, which she has not started.  She denied vaginal symptoms at that time, but today is stating that she has clear vaginal discharge.  Describes nausea throughout the day, but worse when she smells food.  Positive home pregnancy test 1 day ago, and she is surprised that the test today is negative.  States she has not had a period since 9/6 and her periods are typically regular.  Her vaginal discharge is clear in nature, and she is having some crampy lower abdominal pain as well.  Denies flank pain, fever/chills, hematuria, dysuria, etc.  Denies STI risk.  Denies excessive NSAIDs, alcohol, spicy food.  HPI  Past Medical History:  Diagnosis Date   Psoriasis     There are no problems to display for this patient.   History reviewed. No pertinent surgical history.  OB History   No obstetric history on file.      Home Medications    Prior to Admission medications   Medication Sig Start Date End Date Taking? Authorizing Provider  famotidine (PEPCID) 40 MG tablet Take 1 tablet (40 mg total) by mouth daily. 05/26/21   Raspet, Derry Skill, PA-C  metroNIDAZOLE (FLAGYL) 500 MG tablet Take 1 tablet (500 mg total) by mouth 2 (two) times daily. 05/27/21   Chase Picket, MD  omeprazole (PRILOSEC) 20 MG capsule Take 1 capsule (20 mg total) by mouth daily. 11/23/21   Hazel Sams, PA-C    Family History Family History  Problem Relation Age of Onset   Colon cancer Mother     Social History Social History    Tobacco Use   Smoking status: Never   Smokeless tobacco: Never  Substance Use Topics   Alcohol use: Not Currently   Drug use: Not Currently     Allergies   Contrast media [iodinated contrast media] and Penicillins   Review of Systems Review of Systems  Constitutional:  Negative for chills and fever.  HENT:  Negative for sore throat.   Eyes:  Negative for pain and redness.  Respiratory:  Negative for shortness of breath.   Cardiovascular:  Negative for chest pain.  Gastrointestinal:  Positive for abdominal pain. Negative for diarrhea, nausea and vomiting.  Genitourinary:  Positive for vaginal discharge. Negative for decreased urine volume, difficulty urinating, dysuria, flank pain, frequency, genital sores, hematuria and urgency.  Musculoskeletal:  Negative for back pain.  Skin:  Negative for rash.  All other systems reviewed and are negative.   Physical Exam Triage Vital Signs ED Triage Vitals  Enc Vitals Group     BP 11/23/21 1126 131/90     Pulse Rate 11/23/21 1123 84     Resp 11/23/21 1123 17     Temp 11/23/21 1123 98.1 F (36.7 C)     Temp Source 11/23/21 1123 Oral     SpO2 11/23/21 1123 100 %     Weight --      Height --  Head Circumference --      Peak Flow --      Pain Score 11/23/21 1122 0     Pain Loc --      Pain Edu? --      Excl. in GC? --    No data found.  Updated Vital Signs BP 131/90    Pulse 84    Temp 98.1 F (36.7 C) (Oral)    Resp 17    LMP 08/04/2021 (Exact Date)    SpO2 100%   Visual Acuity Right Eye Distance:   Left Eye Distance:   Bilateral Distance:    Right Eye Near:   Left Eye Near:    Bilateral Near:     Physical Exam Vitals reviewed.  Constitutional:      General: She is not in acute distress.    Appearance: Normal appearance. She is not ill-appearing.  HENT:     Head: Normocephalic and atraumatic.     Mouth/Throat:     Mouth: Mucous membranes are moist.     Comments: Moist mucous membranes Eyes:      Extraocular Movements: Extraocular movements intact.     Pupils: Pupils are equal, round, and reactive to light.  Cardiovascular:     Rate and Rhythm: Normal rate and regular rhythm.     Heart sounds: Normal heart sounds.  Pulmonary:     Effort: Pulmonary effort is normal.     Breath sounds: Normal breath sounds. No wheezing, rhonchi or rales.  Abdominal:     General: Bowel sounds are normal. There is no distension.     Palpations: Abdomen is soft. There is no mass.     Tenderness: There is no abdominal tenderness. There is no right CVA tenderness, left CVA tenderness, guarding or rebound.  Genitourinary:    Comments: deferred Skin:    General: Skin is warm.     Capillary Refill: Capillary refill takes less than 2 seconds.     Comments: Good skin turgor  Neurological:     General: No focal deficit present.     Mental Status: She is alert and oriented to person, place, and time.  Psychiatric:        Mood and Affect: Mood normal.        Behavior: Behavior normal.     UC Treatments / Results  Labs (all labs ordered are listed, but only abnormal results are displayed) Labs Reviewed  POCT URINALYSIS DIPSTICK, ED / UC - Abnormal; Notable for the following components:      Result Value   Protein, ur 30 (*)    Leukocytes,Ua TRACE (*)    All other components within normal limits  HCG, QUANTITATIVE, PREGNANCY  POC URINE PREG, ED  CERVICOVAGINAL ANCILLARY ONLY    EKG   Radiology No results found.  Procedures Procedures (including critical care time)  Medications Ordered in UC Medications - No data to display  Initial Impression / Assessment and Plan / UC Course  I have reviewed the triage vital signs and the nursing notes.  Pertinent labs & imaging results that were available during my care of the patient were reviewed by me and considered in my medical decision making (see chart for details).     This patient is a very pleasant 20 y.o. year old female presenting with  possible vaginitis. Afebrile, nontachycardic, no reproducible abd pain or CVAT.  UA with trace leuk, did not send culture. U-preg negative x2. Did sent a quant hcg given positive home pregnancy test.  Given vaginal discharge - Will send self-swab for G/C, trich, yeast, BV testing. Declines HIV, RPR. Safe sex precautions.  CT negative in ED 11/09/21, she was prescribed prilosec but did not start this.  Start prilosec as directed by ED. Sent this again.  ED return precautions discussed. Patient verbalizes understanding and agreement.   Coding Level 4 for review of past notes/labs, order and interpretation of labs today, and prescription drug management    Final Clinical Impressions(s) / UC Diagnoses   Final diagnoses:  Negative pregnancy test  Vaginal discharge     Discharge Instructions      -Your pregnancy test was negative here. We'll check a blood test to make sure.  -If the blood test is negative - Start the medication as directed by the ED. I resent this again. Prilosec once daily x30 days.  -We'll call if the vaginitis screening is positive.     ED Prescriptions     Medication Sig Dispense Auth. Provider   omeprazole (PRILOSEC) 20 MG capsule  (Status: Discontinued) Take 1 capsule (20 mg total) by mouth daily. 30 capsule Hazel Sams, PA-C   omeprazole (PRILOSEC) 20 MG capsule Take 1 capsule (20 mg total) by mouth daily. 30 capsule Hazel Sams, PA-C      PDMP not reviewed this encounter.   Hazel Sams, PA-C 11/23/21 1200

## 2021-11-23 NOTE — Discharge Instructions (Addendum)
-  Your pregnancy test was negative here. We'll check a blood test to make sure.  -If the blood test is negative - Start the medication as directed by the ED. I resent this again. Prilosec once daily x30 days.  -We'll call if the vaginitis screening is positive.

## 2021-11-23 NOTE — ED Triage Notes (Signed)
Pt presents for pregnancy testing. States she has felt nauseas for 2 weeks.

## 2021-11-24 LAB — CERVICOVAGINAL ANCILLARY ONLY
Bacterial Vaginitis (gardnerella): NEGATIVE
Candida Glabrata: NEGATIVE
Candida Vaginitis: NEGATIVE
Chlamydia: POSITIVE — AB
Comment: NEGATIVE
Comment: NEGATIVE
Comment: NEGATIVE
Comment: NEGATIVE
Comment: NEGATIVE
Comment: NORMAL
Neisseria Gonorrhea: NEGATIVE
Trichomonas: NEGATIVE

## 2021-11-26 ENCOUNTER — Telehealth (HOSPITAL_COMMUNITY): Payer: Self-pay | Admitting: Emergency Medicine

## 2021-11-26 MED ORDER — DOXYCYCLINE HYCLATE 100 MG PO CAPS: 100.0000 mg | ORAL_CAPSULE | Freq: Two times a day (BID) | ORAL | 0 refills | Status: AC

## 2022-01-08 ENCOUNTER — Telehealth: Payer: Federal, State, Local not specified - PPO | Admitting: Physician Assistant

## 2022-01-08 DIAGNOSIS — Z79899 Other long term (current) drug therapy: Secondary | ICD-10-CM

## 2022-01-08 DIAGNOSIS — Z202 Contact with and (suspected) exposure to infections with a predominantly sexual mode of transmission: Secondary | ICD-10-CM

## 2022-01-08 MED ORDER — DOXYCYCLINE HYCLATE 100 MG PO TABS: 100.0000 mg | ORAL_TABLET | Freq: Two times a day (BID) | ORAL | 0 refills | Status: DC

## 2022-01-08 NOTE — Patient Instructions (Signed)
Sara Ford, thank you for joining Margaretann Loveless, PA-C for today's virtual visit.  While this provider is not your primary care provider (PCP), if your PCP is located in our provider database this encounter information will be shared with them immediately following your visit.  Consent: (Patient) Sara Ford provided verbal consent for this virtual visit at the beginning of the encounter.  Current Medications:  Current Outpatient Medications:    doxycycline (VIBRA-TABS) 100 MG tablet, Take 1 tablet (100 mg total) by mouth 2 (two) times daily., Disp: 14 tablet, Rfl: 0   famotidine (PEPCID) 40 MG tablet, Take 1 tablet (40 mg total) by mouth daily., Disp: 30 tablet, Rfl: 0   metroNIDAZOLE (FLAGYL) 500 MG tablet, Take 1 tablet (500 mg total) by mouth 2 (two) times daily., Disp: 14 tablet, Rfl: 0   omeprazole (PRILOSEC) 20 MG capsule, Take 1 capsule (20 mg total) by mouth daily., Disp: 30 capsule, Rfl: 0   Medications ordered in this encounter:  Meds ordered this encounter  Medications   doxycycline (VIBRA-TABS) 100 MG tablet    Sig: Take 1 tablet (100 mg total) by mouth 2 (two) times daily.    Dispense:  14 tablet    Refill:  0    Order Specific Question:   Supervising Provider    Answer:   Hyacinth Meeker, BRIAN [[id number]]     *If you need refills on other medications prior to your next appointment, please contact your pharmacy*  Follow-Up: Call back or seek an in-person evaluation if the symptoms worsen or if the condition fails to improve as anticipated.  Other Instructions Safe Sex Practicing safe sex means taking steps before and during sex to reduce your risk of: Getting an STI (sexually transmitted infection). Giving your partner an STI. Unwanted or unplanned pregnancy. How to practice safe sex Ways you can practice safe sex  Limit your sexual partners to only one partner who is having sex with only you. Avoid using alcohol and drugs before having sex. Alcohol and  drugs can affect your judgment. Before having sex with a new partner: Talk to your partner about past partners, past STIs, and drug use. Get screened for STIs and discuss the results with your partner. Ask your partner to get screened too. Check your body regularly for sores, blisters, rashes, or unusual discharge. If you notice any of these problems, visit your health care provider. Avoid sexual contact if you have symptoms of an infection or you are being treated for an STI. While having sex, use a condom. Make sure to: Use a condom every time you have vaginal, oral, or anal sex. Both females and males should wear condoms during oral sex. Keep condoms in place from the beginning to the end of sexual activity. Use a latex condom, if possible. Latex condoms offer the best protection. Use only water-based lubricants with a condom. Using petroleum-based lubricants or oils will weaken the condom and increase the chance that it will break. Ways your health care provider can help you practice safe sex  See your health care provider for regular screenings, exams, and tests for STIs. Talk with your health care provider about what kind of birth control (contraception) is best for you. Get vaccinated against hepatitis B and human papillomavirus (HPV). If you are at risk of being infected with HIV (human immunodeficiency virus), talk with your health care provider about taking a prescription medicine to prevent HIV infection. You are at risk for HIV if you: Are a  man who has sex with other men. Are sexually active with more than one partner. Take drugs by injection. Have a sex partner who has HIV. Have unprotected sex. Have sex with someone who has sex with both men and women. Have had an STI. Follow these instructions at home: Take over-the-counter and prescription medicines only as told by your health care provider. Keep all follow-up visits. This is important. Where to find more  information Centers for Disease Control and Prevention: FootballExhibition.com.br Planned Parenthood: www.plannedparenthood.org Office on [employer] Health: http://hoffman.com/ Summary Practicing safe sex means taking steps before and during sex to reduce your risk getting an STI, giving your partner an STI, and having an unwanted or unplanned pregnancy. Before having sex with a new partner, talk to your partner about past partners, past STIs, and drug use. Use a condom every time you have vaginal, oral, or anal sex. Both females and males should wear condoms during oral sex. Check your body regularly for sores, blisters, rashes, or unusual discharge. If you notice any of these problems, visit your health care provider. See your health care provider for regular screenings, exams, and tests for STIs. This information is not intended to replace advice given to you by your health care provider. Make sure you discuss any questions you have with your health care provider. Document Revised: 04/21/2020 Document Reviewed: 04/21/2020 Elsevier Patient Education  2022 [employer].    If you have been instructed to have an in-person evaluation today at a local Urgent Care facility, please use the link below. It will take you to a list of all of our available [facility], including address, phone number and hours of operation. Please do not delay care.  [facility]  If you or a family member do not have a primary care provider, use the link below to schedule a visit and establish care. When you choose a [facility] primary care physician or advanced practice provider, you gain a long-term partner in health. Find a Primary Care Provider  Learn more about [facility] in-office and virtual care options: [facility] - Get Care Now

## 2022-01-08 NOTE — Progress Notes (Signed)
Virtual Visit Consent   Sara Ford, you are scheduled for a virtual visit with a [facility] provider today.     Just as with appointments in the office, your consent must be obtained to participate.  Your consent will be active for this visit and any virtual visit you may have with one of our providers in the next 365 days.     If you have a MyChart account, a copy of this consent can be sent to you electronically.  All virtual visits are billed to your insurance company just like a traditional visit in the office.    As this is a virtual visit, video technology does not allow for your provider to perform a traditional examination.  This may limit your provider's ability to fully assess your condition.  If your provider identifies any concerns that need to be evaluated in person or the need to arrange testing (such as labs, EKG, etc.), we will make arrangements to do so.     Although advances in technology are sophisticated, we cannot ensure that it will always work on either your end or our end.  If the connection with a video visit is poor, the visit may have to be switched to a telephone visit.  With either a video or telephone visit, we are not always able to ensure that we have a secure connection.     I need to obtain your verbal consent now.   Are you willing to proceed with your visit today?    Sara Ford has provided verbal consent on 01/08/2022 for a virtual visit (video or telephone).   Margaretann Loveless, PA-C   Date: 01/08/2022 1:42 PM   Virtual Visit via Video Note   I, Margaretann Loveless, connected with  Sara Ford  ([id number], [birth year]) on 01/08/22 at  1:45 PM EST by a video-enabled telemedicine application and verified that I am speaking with the correct person using two identifiers.  Location: Patient: Virtual Visit Location Patient: Mobile Provider: Virtual Visit Location Provider: Home Office   I discussed the limitations of evaluation and  management by telemedicine and the availability of in person appointments. The patient expressed understanding and agreed to proceed.    History of Present Illness: Sara Ford is a 21 y.o. who identifies as a female who was assigned female at birth, and is being seen today for STD exposure. She was seen in Dec 2022 and had tested positive for Chlamydia. She completed her treatment without issue. Her partner, however, did not take correctly. They have had intercourse since. She is currently not having any symptoms. Her partner was seen today and was instructed to repeat treatment, test results are still pending for him, and they advised for Ms. Tarry to repeat treatment as well, but advised for her to get medication refilled through our department as they did not have any availability today.   Problems: There are no problems to display for this patient.   Allergies:  Allergies  Allergen Reactions   Contrast Media [Iodinated Contrast Media] Anaphylaxis   Penicillins    Medications:  Current Outpatient Medications:    doxycycline (VIBRA-TABS) 100 MG tablet, Take 1 tablet (100 mg total) by mouth 2 (two) times daily., Disp: 14 tablet, Rfl: 0   famotidine (PEPCID) 40 MG tablet, Take 1 tablet (40 mg total) by mouth daily., Disp: 30 tablet, Rfl: 0   metroNIDAZOLE (FLAGYL) 500 MG tablet, Take 1 tablet (500 mg total) by mouth 2 (  two) times daily., Disp: 14 tablet, Rfl: 0   omeprazole (PRILOSEC) 20 MG capsule, Take 1 capsule (20 mg total) by mouth daily., Disp: 30 capsule, Rfl: 0  Observations/Objective: Patient is well-developed, well-nourished in no acute distress.  Resting comfortably  Head is normocephalic, atraumatic.  No labored breathing.  Speech is clear and coherent with logical content.  Patient is alert and oriented at baseline.    Assessment and Plan: 1. Chlamydia contact - doxycycline (VIBRA-TABS) 100 MG tablet; Take 1 tablet (100 mg total) by mouth 2 (two) times daily.   Dispense: 14 tablet; Refill: 0  - Previously positive for Chlamydia - Completed treatment in Dec, but partner did not take correctly; re-treatment advised. - Doxycycline provided - Abstain from sexual intercourse during treatment and for 1 week following treatment - Safe sex practices discussed - Advised to seek TOC 2 weeks following treatment completion; voiced understanding  Follow Up Instructions: I discussed the assessment and treatment plan with the patient. The patient was provided an opportunity to ask questions and all were answered. The patient agreed with the plan and demonstrated an understanding of the instructions.  A copy of instructions were sent to the patient via MyChart unless otherwise noted below.   The patient was advised to call back or seek an in-person evaluation if the symptoms worsen or if the condition fails to improve as anticipated.  Time:  I spent 8 minutes with the patient via telehealth technology discussing the above problems/concerns.    Margaretann Loveless, PA-C

## 2022-01-08 NOTE — Progress Notes (Signed)
Patient had question about shape and color of pill. Discussed most likely from different manufacturers and reassured same medication.

## 2022-01-21 ENCOUNTER — Other Ambulatory Visit: Payer: Self-pay

## 2022-01-21 ENCOUNTER — Encounter (HOSPITAL_COMMUNITY): Payer: Self-pay

## 2022-01-21 ENCOUNTER — Ambulatory Visit (HOSPITAL_COMMUNITY)
Admission: EM | Admit: 2022-01-21 | Discharge: 2022-01-21 | Disposition: A | Discharge status: Home / Self Care | Payer: Federal, State, Local not specified - PPO | Attending: Internal Medicine | Admitting: Internal Medicine

## 2022-01-21 DIAGNOSIS — L409 Psoriasis, unspecified: Secondary | ICD-10-CM | POA: Insufficient documentation

## 2022-01-21 DIAGNOSIS — Z113 Encounter for screening for infections with a predominantly sexual mode of transmission: Secondary | ICD-10-CM | POA: Insufficient documentation

## 2022-01-21 DIAGNOSIS — Z202 Contact with and (suspected) exposure to infections with a predominantly sexual mode of transmission: Secondary | ICD-10-CM

## 2022-01-21 LAB — CBC WITH DIFFERENTIAL/PLATELET
Abs Immature Granulocytes: 0.04 10*3/uL (ref 0.00–0.07)
Basophils Absolute: 0 10*3/uL (ref 0.0–0.1)
Basophils Relative: 1 %
Eosinophils Absolute: 0.3 10*3/uL (ref 0.0–0.5)
Eosinophils Relative: 3 %
HCT: 39.2 % (ref 36.0–46.0)
Hemoglobin: 12.8 g/dL (ref 12.0–15.0)
Immature Granulocytes: 1 %
Lymphocytes Relative: 33 %
Lymphs Abs: 2.9 10*3/uL (ref 0.7–4.0)
MCH: 30.5 pg (ref 26.0–34.0)
MCHC: 32.7 g/dL (ref 30.0–36.0)
MCV: 93.3 fL (ref 80.0–100.0)
Monocytes Absolute: 0.9 10*3/uL (ref 0.1–1.0)
Monocytes Relative: 10 %
Neutro Abs: 4.6 10*3/uL (ref 1.7–7.7)
Neutrophils Relative %: 52 %
Platelets: 320 10*3/uL (ref 150–400)
RBC: 4.2 MIL/uL (ref 3.87–5.11)
RDW: 12.1 % (ref 11.5–15.5)
WBC: 8.8 10*3/uL (ref 4.0–10.5)
nRBC: 0 % (ref 0.0–0.2)

## 2022-01-21 LAB — BASIC METABOLIC PANEL
Anion gap: 5 (ref 5–15)
BUN: 10 mg/dL (ref 6–20)
CO2: 28 mmol/L (ref 22–32)
Calcium: 8.8 mg/dL — ABNORMAL LOW (ref 8.9–10.3)
Chloride: 103 mmol/L (ref 98–111)
Creatinine, Ser: 0.85 mg/dL (ref 0.44–1.00)
GFR, Estimated: >60 mL/min (ref >60)
Glucose, Bld: 109 mg/dL — ABNORMAL HIGH (ref 70–99)
Potassium: 3.8 mmol/L (ref 3.5–5.1)
Sodium: 136 mmol/L (ref 135–145)

## 2022-01-21 LAB — HIV ANTIBODY (ROUTINE TESTING W REFLEX): HIV Screen 4th Generation wRfx: NONREACTIVE

## 2022-01-21 LAB — RPR: RPR Ser Ql: NONREACTIVE

## 2022-01-21 NOTE — ED Triage Notes (Signed)
Pt presents to the office for STI testing.

## 2022-01-21 NOTE — ED Provider Notes (Signed)
[facility]    CSN: [id number] Arrival date & time: 01/21/22  0807      History   Chief Complaint Chief Complaint  Patient presents with   Exposure to STD    HPI Sara Ford is a 21 y.o. female with a history of psoriasis status immunologic agent use comes to urgent care for STD screening.  Patient has no symptoms.  She is engaging in unprotected sexual intercourse.  No dysuria urgency or frequency.  Patient moved here from [facility] and does not have a dermatologist to manage her psoriasis.  She is requesting recommendations for dermatologist.  HPI  Past Medical History:  Diagnosis Date   Psoriasis     There are no problems to display for this patient.   History reviewed. No pertinent surgical history.  OB History   No obstetric history on file.      Home Medications    Prior to Admission medications   Medication Sig Start Date End Date Taking? Authorizing Provider  doxycycline (VIBRA-TABS) 100 MG tablet Take 1 tablet (100 mg total) by mouth 2 (two) times daily. 01/08/22   Margaretann Loveless, PA-C  famotidine (PEPCID) 40 MG tablet Take 1 tablet (40 mg total) by mouth daily. 05/26/21   Raspet, Noberto Retort, PA-C  metroNIDAZOLE (FLAGYL) 500 MG tablet Take 1 tablet (500 mg total) by mouth 2 (two) times daily. 05/27/21   Merrilee Jansky, MD  omeprazole (PRILOSEC) 20 MG capsule Take 1 capsule (20 mg total) by mouth daily. 11/23/21   Rhys Martini, PA-C    Family History Family History  Problem Relation Age of Onset   Colon cancer Mother     Social History Social History   Tobacco Use   Smoking status: Never   Smokeless tobacco: Never  Substance Use Topics   Alcohol use: Not Currently   Drug use: Not Currently     Allergies   Contrast media [iodinated contrast media] and Penicillins   Review of Systems Review of Systems  Cardiovascular: Negative.   Gastrointestinal: Negative.   Musculoskeletal: Negative.  Negative for  arthralgias, joint swelling and myalgias.  Skin:  Positive for rash and wound. Negative for color change.    Physical Exam Triage Vital Signs ED Triage Vitals [01/21/22 0830]  Enc Vitals Group     BP 121/78     Pulse Rate 78     Resp 16     Temp 98.3 F (36.8 C)     Temp Source Oral     SpO2 100 %     Weight      Height      Head Circumference      Peak Flow      Pain Score      Pain Loc      Pain Edu?      Excl. in GC?    No data found.  Updated Vital Signs BP 121/78 (BP Location: Left Arm)    Pulse 78    Temp 98.3 F (36.8 C) (Oral)    Resp 16    SpO2 100%   Visual Acuity Right Eye Distance:   Left Eye Distance:   Bilateral Distance:    Right Eye Near:   Left Eye Near:    Bilateral Near:     Physical Exam Vitals and nursing note reviewed.  Constitutional:      General: She is not in acute distress.    Appearance: Normal appearance. She is not ill-appearing.  Cardiovascular:     Rate and Rhythm: Normal rate.  Abdominal:     General: Bowel sounds are normal.     Palpations: Abdomen is soft.  Skin:    Comments: Generalized plaque-like skin lesions with silver scales.  Neurological:     Mental Status: She is alert.     UC Treatments / Results  Labs (all labs ordered are listed, but only abnormal results are displayed) Labs Reviewed  HIV ANTIBODY (ROUTINE TESTING W REFLEX)  RPR  CBC WITH DIFFERENTIAL/PLATELET  BASIC METABOLIC PANEL  CERVICOVAGINAL ANCILLARY ONLY    EKG   Radiology No results found.  Procedures Procedures (including critical care time)  Medications Ordered in UC Medications - No data to display  Initial Impression / Assessment and Plan / UC Course  I have reviewed the triage vital signs and the nursing notes.  Pertinent labs & imaging results that were available during my care of the patient were reviewed by me and considered in my medical decision making (see chart for details).     1.  Screening for STD: Cervical  vaginal swab for GC/chlamydia/trichomonas, HIV/RPR. Patient will be called with recommendations if labs are abnormal Return precautions given  2.  Psoriasis: Patient is advised to follow-up with [facility] dermatology.  Final Clinical Impressions(s) / UC Diagnoses   Final diagnoses:  Screen for STD (sexually transmitted disease)  Psoriasis     Discharge Instructions      We will call you with recommendations if labs are abnormal Safe sex practices recommended Please call the dermatology practice and make an appointment.   ED Prescriptions   None    PDMP not reviewed this encounter.   Merrilee Jansky, MD 01/21/22 [phone]

## 2022-01-21 NOTE — Discharge Instructions (Addendum)
We will call you with recommendations if labs are abnormal Safe sex practices recommended Please call the dermatology practice and make an appointment.

## 2022-01-22 ENCOUNTER — Telehealth (HOSPITAL_COMMUNITY): Payer: Self-pay | Admitting: Emergency Medicine

## 2022-01-22 ENCOUNTER — Other Ambulatory Visit: Payer: Self-pay

## 2022-01-22 ENCOUNTER — Ambulatory Visit (HOSPITAL_COMMUNITY)
Admission: EM | Admit: 2022-01-22 | Discharge: 2022-01-22 | Disposition: A | Discharge status: Home / Self Care | Payer: Federal, State, Local not specified - PPO | Attending: Internal Medicine | Admitting: Internal Medicine

## 2022-01-22 DIAGNOSIS — Z202 Contact with and (suspected) exposure to infections with a predominantly sexual mode of transmission: Secondary | ICD-10-CM

## 2022-01-22 DIAGNOSIS — Z113 Encounter for screening for infections with a predominantly sexual mode of transmission: Secondary | ICD-10-CM | POA: Diagnosis present

## 2022-01-22 LAB — CERVICOVAGINAL ANCILLARY ONLY
Chlamydia: NEGATIVE
Comment: NEGATIVE
Comment: NEGATIVE
Comment: NORMAL
Neisseria Gonorrhea: NEGATIVE
Trichomonas: POSITIVE — AB

## 2022-01-22 NOTE — Telephone Encounter (Signed)
Spoke to patient about recent positive result for Trichomonas.  Patient is concerned this is not accurate due to her stating she has not been sexually active since she was swabbed and treated for Chlamydia in December.   Reviewed with Dr. Leonides Grills, patient to return for nurse visit reswab and will treat based on new results, after patient refused treatment until she has confirmation.

## 2022-01-22 NOTE — ED Triage Notes (Signed)
Pt presents for swab recollection.

## 2022-01-25 LAB — CYTOLOGY, (ORAL, ANAL, URETHRAL) ANCILLARY ONLY
Chlamydia: NEGATIVE
Comment: NEGATIVE
Comment: NEGATIVE
Comment: NORMAL
Neisseria Gonorrhea: NEGATIVE
Trichomonas: NEGATIVE

## 2022-02-02 ENCOUNTER — Emergency Department (HOSPITAL_COMMUNITY)
Admission: EM | Admit: 2022-02-02 | Discharge: 2022-02-03 | Disposition: A | Discharge status: Home / Self Care | Payer: Federal, State, Local not specified - PPO | Attending: Emergency Medicine | Admitting: Emergency Medicine

## 2022-02-02 DIAGNOSIS — R1310 Dysphagia, unspecified: Secondary | ICD-10-CM | POA: Insufficient documentation

## 2022-02-02 DIAGNOSIS — R110 Nausea: Secondary | ICD-10-CM | POA: Diagnosis not present

## 2022-02-02 DIAGNOSIS — R21 Rash and other nonspecific skin eruption: Secondary | ICD-10-CM | POA: Insufficient documentation

## 2022-02-02 DIAGNOSIS — R42 Dizziness and giddiness: Secondary | ICD-10-CM | POA: Diagnosis not present

## 2022-02-02 LAB — CBC WITH DIFFERENTIAL/PLATELET
Abs Immature Granulocytes: 0.05 10*3/uL (ref 0.00–0.07)
Basophils Absolute: 0.1 10*3/uL (ref 0.0–0.1)
Basophils Relative: 1 %
Eosinophils Absolute: 0.4 10*3/uL (ref 0.0–0.5)
Eosinophils Relative: 4 %
HCT: 41.2 % (ref 36.0–46.0)
Hemoglobin: 13.7 g/dL (ref 12.0–15.0)
Immature Granulocytes: 1 %
Lymphocytes Relative: 28 %
Lymphs Abs: 2.9 10*3/uL (ref 0.7–4.0)
MCH: 31.5 pg (ref 26.0–34.0)
MCHC: 33.3 g/dL (ref 30.0–36.0)
MCV: 94.7 fL (ref 80.0–100.0)
Monocytes Absolute: 1.1 10*3/uL — ABNORMAL HIGH (ref 0.1–1.0)
Monocytes Relative: 10 %
Neutro Abs: 5.9 10*3/uL (ref 1.7–7.7)
Neutrophils Relative %: 56 %
Platelets: 353 10*3/uL (ref 150–400)
RBC: 4.35 MIL/uL (ref 3.87–5.11)
RDW: 11.9 % (ref 11.5–15.5)
WBC: 10.4 10*3/uL (ref 4.0–10.5)
nRBC: 0 % (ref 0.0–0.2)

## 2022-02-02 LAB — BASIC METABOLIC PANEL
Anion gap: 6 (ref 5–15)
BUN: 13 mg/dL (ref 6–20)
CO2: 28 mmol/L (ref 22–32)
Calcium: 9.5 mg/dL (ref 8.9–10.3)
Chloride: 104 mmol/L (ref 98–111)
Creatinine, Ser: 0.84 mg/dL (ref 0.44–1.00)
GFR, Estimated: >60 mL/min (ref >60)
Glucose, Bld: 89 mg/dL (ref 70–99)
Potassium: 4.4 mmol/L (ref 3.5–5.1)
Sodium: 138 mmol/L (ref 135–145)

## 2022-02-02 MED ORDER — PREDNISONE 20 MG PO TABS: 60.0000 mg | ORAL_TABLET | Freq: Once | ORAL | Status: DC

## 2022-02-02 MED ORDER — FAMOTIDINE 20 MG PO TABS: 20.0000 mg | ORAL_TABLET | Freq: Once | ORAL | Status: DC

## 2022-02-02 NOTE — ED Provider Triage Note (Signed)
Emergency Medicine Provider Triage Evaluation Note ? ?Sara Ford , a 21 y.o. female  was evaluated in triage.  Pt complains of allergic reaction and dizziness.  Patient reports that she woke today at noon and is concerned that she is having an allergic reaction to a necklace that she fell asleep wearing.  Patient reports that she has a rash on her neck which is new.  Patient endorses facial swelling, trouble swallowing and difficulty breathing.   ? ?Patient also reports that she has developed nausea and dizziness since waking this morning.  States that dizziness has been getting progressively worse. ? ?Review of Systems  ?Positive: Rash, trouble swallowing, trouble breathing, nausea, dizziness ?Negative: Fever, chills, sore throat, syncope, numbness, weakness ? ?Physical Exam  ?BP 113/80 (BP Location: Right Arm)   Pulse 86   Temp 98.3 ?F (36.8 ?C) (Oral)   Resp 16   SpO2 99%  ?Gen:   Awake, no distress   ?Resp:  Normal effort, clear to auscultation bilaterally.  No stridor.  Speaks in full complete sentences without difficulty. ?MSK:   Moves extremities without difficulty  ?Other:  +2 right tonsil, +1 left tonsil.  No erythema or swelling to oropharynx.  Handles oral secretions without difficulty.  Patient has maculopapular rash to her neck.  No urticaria, petechia, or purpura noted. ? ?Medical Decision Making  ?Medically screening exam initiated at 11:00 PM.  Appropriate orders placed.  Sara Ford was informed that the remainder of the evaluation will be completed by another provider, this initial triage assessment does not replace that evaluation, and the importance of remaining in the ED until their evaluation is complete. ? ?While patient endorses facial swelling, trouble swallowing and trouble breathing there are no signs of this on her physical exam.  However due to concerns of allergic reaction we will give oral dose of prednisone and Pepcid.  Benadryl was not given as patient took this  prior to arriving in the emergency department.   ? ?We will order EKG and basic lab work due to patient's dizziness. ?  ?Haskel Schroeder, PA-C ?02/02/22 2303 ? ?

## 2022-02-02 NOTE — ED Triage Notes (Signed)
Pt reported to ED with c/o allergic reaction from wearing a necklace. States she had swelling to face neck and throat and took a benadryl and symptom were relieved but feels as if they are re reoccuring. ? ?

## 2022-02-03 LAB — I-STAT BETA HCG BLOOD, ED (MC, WL, AP ONLY): I-stat hCG, quantitative: <5 m[IU]/mL (ref <5)

## 2022-02-03 NOTE — ED Provider Notes (Signed)
[facility] EMERGENCY DEPARTMENT Provider Note   CSN: [id number] Arrival date & time: 02/02/22  2059     History  Chief Complaint  Patient presents with   Allergic Reaction    Sara Ford is a 21 y.o. female with past medical history of psoriasis.  Presents to the emergency department with a complaint of rash and dizziness.  Patient reports that she received a new necklace as a gift.  Patient wore the necklace all day without any incident however woke up on 3/7 at noon and noticed a rash to her neck.  Patient reports that she tried using some moisturizer with no improvement in the rash.  Patient was concerned for allergic reaction as she reported facial swelling, trouble swallowing, and trouble breathing.  Patient took Benadryl shortly after waking and then again at 8 PM.  Patient reports that she has had improvement in her shortness of breath and trouble swallowing while waiting in the emergency department waiting room.  Patient states that she also had nausea and dizziness upon waking.  Reports that dizziness has gotten progressively worse throughout the day.  Patient describes dizziness as "my whole body feels staticky."    Patient denies any fevers, chills, chest pain, abdominal pain, vomiting.   Allergic Reaction Presenting symptoms: difficulty swallowing and rash       Home Medications Prior to Admission medications   Medication Sig Start Date End Date Taking? Authorizing Provider  doxycycline (VIBRA-TABS) 100 MG tablet Take 1 tablet (100 mg total) by mouth 2 (two) times daily. 01/08/22   Mar Daring, PA-C  famotidine (PEPCID) 40 MG tablet Take 1 tablet (40 mg total) by mouth daily. 05/26/21   Raspet, Derry Skill, PA-C  omeprazole (PRILOSEC) 20 MG capsule Take 1 capsule (20 mg total) by mouth daily. 11/23/21   Hazel Sams, PA-C      Allergies    Contrast media [iodinated contrast media] and Penicillins    Review of Systems   Review of Systems   Constitutional:  Negative for chills and fever.  HENT:  Positive for facial swelling and trouble swallowing.   Respiratory:  Positive for shortness of breath.   Cardiovascular:  Negative for chest pain.  Gastrointestinal:  Positive for nausea. Negative for abdominal pain and vomiting.  Musculoskeletal:  Negative for back pain and neck pain.  Skin:  Positive for rash. Negative for color change.  Neurological:  Positive for dizziness. Negative for tremors, seizures, syncope, facial asymmetry, speech difficulty, weakness, light-headedness, numbness and headaches.  Psychiatric/Behavioral:  Negative for confusion.    Physical Exam Updated Vital Signs BP 113/80 (BP Location: Right Arm)    Pulse 86    Temp 98.3 F (36.8 C) (Oral)    Resp 16    SpO2 99%  Physical Exam Vitals and nursing note reviewed.  Constitutional:      General: She is not in acute distress.    Appearance: She is not ill-appearing, toxic-appearing or diaphoretic.  HENT:     Head: Normocephalic.     Jaw: No trismus, tenderness, swelling, pain on movement or malocclusion.     Comments: No facial swelling    Mouth/Throat:     Lips: Pink. No lesions.     Mouth: Mucous membranes are moist. No angioedema.     Dentition: No dental abscesses.     Tongue: No lesions. Tongue does not deviate from midline.     Palate: No mass and lesions.     Pharynx: Oropharynx is  clear. Uvula midline. No pharyngeal swelling, oropharyngeal exudate, posterior oropharyngeal erythema or uvula swelling.     Tonsils: No tonsillar exudate or tonsillar abscesses. 2+ on the right. 1+ on the left.     Comments: Handles oral secretions without difficulty Eyes:     General: No scleral icterus.       Right eye: No discharge.        Left eye: No discharge.  Neck:     Comments: No swelling to submandibular space Cardiovascular:     Rate and Rhythm: Normal rate.  Pulmonary:     Effort: Pulmonary effort is normal. No tachypnea, bradypnea or respiratory  distress.     Breath sounds: Normal breath sounds. No stridor.     Comments: Speaks in full complete sentences without difficulty. Abdominal:     General: Abdomen is flat. There is no distension. There are no signs of injury.     Palpations: Abdomen is soft. There is no mass or pulsatile mass.     Tenderness: There is no abdominal tenderness. There is no guarding or rebound.  Musculoskeletal:     Cervical back: Full passive range of motion without pain, normal range of motion and neck supple. No edema, erythema, signs of trauma, rigidity, torticollis or crepitus. No pain with movement, spinous process tenderness or muscular tenderness. Normal range of motion.  Skin:    General: Skin is warm and dry.     Findings: Rash present. No petechiae. Rash is macular, papular and scaling. Rash is not crusting, nodular, purpuric, pustular, urticarial or vesicular.     Comments: Scaly plaques noted to patient's chest, face, and neck.  Macular papular rash to sides of patient's neck as imaged below.  No rash to oral mucosa or palms of hands  Neurological:     General: No focal deficit present.     Mental Status: She is alert.     GCS: GCS eye subscore is 4. GCS verbal subscore is 5. GCS motor subscore is 6.     Gait: Gait is intact. Gait normal.  Psychiatric:        Behavior: Behavior is cooperative.      ED Results / Procedures / Treatments   Labs (all labs ordered are listed, but only abnormal results are displayed) Labs Reviewed  CBC WITH DIFFERENTIAL/PLATELET - Abnormal; Notable for the following components:      Result Value   Monocytes Absolute 1.1 (*)    All other components within normal limits  BASIC METABOLIC PANEL  I-STAT BETA HCG BLOOD, ED ([facility], [facility], [facility] ONLY)    EKG EKG Interpretation  Date/Time:  Tuesday February 02 2022 23:04:10 EST Ventricular Rate:  81 PR Interval:  148 QRS Duration: 82 QT Interval:  368 QTC Calculation: 427 R Axis:   24 Text Interpretation: Normal sinus  rhythm Minimal voltage criteria for LVH, may be normal variant ( R in aVL ) Borderline ECG No previous ECGs available Confirmed by Ripley Fraise [phone]) on 02/03/2022 3:57:56 AM  Radiology No results found.  Procedures Procedures    Medications Ordered in ED Medications - No data to display  ED Course/ Medical Decision Making/ A&P                           Medical Decision Making Amount and/or Complexity of Data Reviewed Labs: ordered.   Alert 21 year old female in no acute distress, nontoxic-appearing.  Presents emergency department with a chief complaint of rash and  dizziness.  Information obtained from patient.  Past medical records were reviewed including previous provider notes and labs.  Patient has medical history as outlined in HPI which complicates her care.  Patient reports facial swelling, trouble swallowing, and trouble breathing.  No facial swelling noted on physical exam.  Patient speaks in full complete sentences without difficulty and lungs are clear to auscultation bilaterally without any stridor.  Patient has no swelling to oropharynx is able to handle oral secretions without difficulty.  No urticaria or hives seen on exam.  Low suspicion for angioedema at this time.  Due to patient's reports of dizziness EKG, BMP, CBC, and beta-hCG were obtained.    EKG was independently interpreted by myself and tracing shows sinus rhythm.    All labs were independently interpreted by myself.  Pertinent findings include: -CBC, BMP, i-STAT beta-hCG are unremarkable.  Patient's rash she was offered hydrocortisone cream however declines this medication as she reports it causes worsening of skin conditions in the past.  Patient was also ordered prednisone to help with her rash however declines this medication due to concerns for worsening of her rash.  Patient given information to follow-up with dermatology in the outpatient setting.  Patient hemodynamically stable at this time.   Able to stand and ambulate without difficulty.  No signs of any focal neurological deficits.  Will have patient follow-up with her PCP in the outpatient setting.  Discussed results, findings, treatment and follow up. Patient advised of return precautions. Patient verbalized understanding and agreed with plan.         Final Clinical Impression(s) / ED Diagnoses Final diagnoses:  Rash and nonspecific skin eruption  Dizziness    Rx / DC Orders ED Discharge Orders     None         Dyann Ruddle 02/03/22 0612    Ripley Fraise, MD 02/03/22 [phone]

## 2022-02-03 NOTE — Discharge Instructions (Signed)
You came to the emergency department today to be evaluated for your rash and dizziness.  Your physical exam and lab work were reassuring.  You are offered a course of steroids or hydrocortisone cream for your rash but declined at this time.  Please follow-up with a dermatologist listed on the paperwork below.  Please follow-up with your primary care provider. ? ?[facility]: ? ? ?[facility]  ?3.2 (9) ? Dermatologist  ?[address]  ?[phone]  ? ?Dr. Mertha Finders, MD  ?2.6 (18) ? Dermatologist  ?[address]  ?[phone] ? ?[facility]  ?3.5 (3) ? [facility]  ?[address] [place]  ?[phone]  ? ?[facility]  ?4.0 (4) ? Dermatologist  ?[address]  ?[phone] ? ?Janalyn Harder MD  ?3.0 (2) ? Dermatologist  ?[address]  ?[phone] ? ?Hoyle Sauer  ?2.7 (6) ? Dermatologist  ?[address] [place]  ?[phone] ? ?[place] Amy Y MD  ?2.0 (1) ? Dermatologist  ?[address] [place]  ?[phone] ? ?[facility]  ?5.0 (3) ? Doctor  ?[address]  ?[phone]  ? ? ? ? ?Get help right away if you: ?Have a fever and your symptoms suddenly get worse. ?Develop confusion. ?Have a severe headache or a stiff neck. ?Have severe joint pains or stiffness. ?Have a seizure. ?Develop a rash that covers all or most of your body. The rash may or may not be painful. ?Develop blisters that: ?Are on top of the rash. ?Grow larger or grow together. ?Are painful. ?Are inside your nose or mouth. ?Develop a rash that: ?Looks like purple pinprick-sized spots all over your body. ?Has a "bull's eye" or looks like a target. ?Is not related to sun exposure, is red and painful, and causes your skin to peel. ?

## 2022-02-22 ENCOUNTER — Other Ambulatory Visit: Payer: Self-pay

## 2022-02-22 ENCOUNTER — Encounter (HOSPITAL_COMMUNITY): Payer: Self-pay | Admitting: *Deleted

## 2022-02-22 ENCOUNTER — Ambulatory Visit (HOSPITAL_COMMUNITY)
Admission: EM | Admit: 2022-02-22 | Discharge: 2022-02-22 | Disposition: A | Discharge status: Home / Self Care | Payer: Federal, State, Local not specified - PPO | Attending: Internal Medicine | Admitting: Internal Medicine

## 2022-02-22 ENCOUNTER — Ambulatory Visit (INDEPENDENT_AMBULATORY_CARE_PROVIDER_SITE_OTHER): Payer: Federal, State, Local not specified - PPO

## 2022-02-22 DIAGNOSIS — M25562 Pain in left knee: Secondary | ICD-10-CM

## 2022-02-22 LAB — POC URINE PREG, ED: Preg Test, Ur: NEGATIVE

## 2022-02-22 MED ORDER — IBUPROFEN 800 MG PO TABS: 800.0000 mg | ORAL_TABLET | Freq: Three times a day (TID) | ORAL | 0 refills | Status: AC | PRN

## 2022-02-22 NOTE — ED Triage Notes (Signed)
Pt reports Lt knee pin afgter working out. Pt reports pain for 2 weeks. ?

## 2022-02-22 NOTE — ED Provider Notes (Signed)
?[place] ? ? ? ?CSN: [id number] ?Arrival date & time: 02/22/22  1828 ? ? ?  ? ?History   ?Chief Complaint ?Chief Complaint  ?Patient presents with  ? Knee Pain  ?  LT  ? ? ?HPI ?Sara Ford is a 21 y.o. female.  ? ?The patient is a 21 year old female who presents for left knee pain.  Symptoms started on 3/16, when she was lifting weights.  She states she was attempting to do the leg press with a 45 pound weight, when she felt a "pop" in the left knee as she was going down.  She states since that time she has had difficulty weightbearing.  She states when she is sitting, she has dull pain, but when she starts to weight-bear, the pain changes to sharp and radiates down into the lower leg and into the left hip.  She states that she did have swelling at the initial time of the injury, but that has since improved.  She has been using ice, and elevation for her symptoms.  Also states that she was attempting to take Motrin, but that has not helped so she stopped taking any medication at this time.  She does have a history of a previous meniscal and ACL tear in the left knee. ? ? ? ?Past Medical History:  ?Diagnosis Date  ? Psoriasis   ? ? ?There are no problems to display for this patient. ? ? ?History reviewed. No pertinent surgical history. ? ?OB History   ?No obstetric history on file. ?  ? ? ? ?Home Medications   ? ?Prior to Admission medications   ?Medication Sig Start Date End Date Taking? Authorizing Provider  ?doxycycline (VIBRA-TABS) 100 MG tablet Take 1 tablet (100 mg total) by mouth 2 (two) times daily. 01/08/22   Mar Daring, PA-C  ?famotidine (PEPCID) 40 MG tablet Take 1 tablet (40 mg total) by mouth daily. 05/26/21   Raspet, Derry Skill, PA-C  ?omeprazole (PRILOSEC) 20 MG capsule Take 1 capsule (20 mg total) by mouth daily. 11/23/21   Hazel Sams, PA-C  ? ? ?Family History ?Family History  ?Problem Relation Age of Onset  ? Colon cancer Mother   ? ? ?Social History ?Social History   ? ?Tobacco Use  ? Smoking status: Never  ? Smokeless tobacco: Never  ?Substance Use Topics  ? Alcohol use: Not Currently  ? Drug use: Not Currently  ? ? ? ?Allergies   ?Contrast media [iodinated contrast media] and Penicillins ? ? ?Review of Systems ?Review of Systems  ?Constitutional: Negative.   ?Musculoskeletal:  Positive for joint swelling (left knee).  ?     Left knee pain  ?Psychiatric/Behavioral: Negative.    ? ? ?Physical Exam ?Triage Vital Signs ?ED Triage Vitals  ?Enc Vitals Group  ?   BP 02/22/22 1926 110/74  ?   Pulse Rate 02/22/22 1926 91  ?   Resp 02/22/22 1926 18  ?   Temp 02/22/22 1926 98.3 ?F (36.8 ?C)  ?   Temp src --   ?   SpO2 02/22/22 1926 94 %  ?   Weight --   ?   Height --   ?   Head Circumference --   ?   Peak Flow --   ?   Pain Score 02/22/22 1922 8  ?   Pain Loc --   ?   Pain Edu? --   ?   Excl. in [place]? --   ? ?No  data found. ? ?Updated Vital Signs ?BP 110/74   Pulse 91   Temp 98.3 ?F (36.8 ?C)   Resp 18   LMP 11/19/2021 Comment: PCOS HX  SpO2 94%  ? ?Visual Acuity ?Right Eye Distance:   ?Left Eye Distance:   ?Bilateral Distance:   ? ?Right Eye Near:   ?Left Eye Near:    ?Bilateral Near:    ? ?Physical Exam ?Vitals reviewed.  ?Constitutional:   ?   Appearance: Normal appearance.  ?Musculoskeletal:  ?   Right upper leg: Normal.  ?   Left upper leg: Normal.  ?   Right knee: Bony tenderness present. No swelling, deformity or erythema.  ?   Left knee: Decreased range of motion. Tenderness present over the medial joint line, lateral joint line and patellar tendon.  ?Skin: ?   Capillary Refill: Capillary refill takes less than 2 seconds.  ?Neurological:  ?   Mental Status: She is alert and oriented to person, place, and time.  ? ? ? ?UC Treatments / Results  ?Labs ?(all labs ordered are listed, but only abnormal results are displayed) ?Labs Reviewed  ?POC URINE PREG, ED  ? ? ?EKG ? ? ?Radiology ?DG Knee Complete 4 Views Left ? ?Result Date: 02/22/2022 ?CLINICAL DATA:  Left knee injury while  working out. EXAM: LEFT KNEE - COMPLETE 4+ VIEW COMPARISON:  None. FINDINGS: No evidence of fracture, dislocation, or joint effusion. No evidence of arthropathy. Postoperative changes are seen involving the proximal right tibia with a small radiopaque surgical plate seen along the lateral epicondyle of the distal left femur. Soft tissues are unremarkable. IMPRESSION: No acute findings. Electronically Signed   By: Virgina Norfolk M.D.   On: 02/22/2022 19:49   ? ?Procedures ?Procedures (including critical care time) ? ?Medications Ordered in UC ?Medications - No data to display ? ?Initial Impression / Assessment and Plan / UC Course  ?I have reviewed the triage vital signs and the nursing notes. ? ?Pertinent labs & imaging results that were available during my care of the patient were reviewed by me and considered in my medical decision making (see chart for details). ? ?The patient is a 21 year old female who presents after a left knee injury.  Symptoms started on 3/16, when she was lifting weights.  She was doing the leg press machine, and developed a "pop" in the left knee as she was coming down.  The time of the injury, the patient did have swelling to the left knee, which has since resolved.  Since that time, she has had pain with weightbearing.  Pain does radiate into the left hip and into the left lower leg.  She does have a previous injury to the left knee.  X-rays were performed which showed no acute findings.  Discussion with the patient and informed her that she may need an MRI to really determine what is going on with any ligaments or tendons in the knee.  Patient will be provided a hinged knee brace to help with support and stability.  Recommended that she continue RICE therapy at this time.  We will also provide the patient with ibuprofen for pain.  Informed that she can follow-up at [place], or [facility].  Information will be provided for each.  Follow-up as needed. ?Final Clinical  Impressions(s) / UC Diagnoses  ? ?Final diagnoses:  ?None  ? ?Discharge Instructions   ?None ?  ? ?ED Prescriptions   ?None ?  ? ?PDMP not reviewed this encounter. ?  ?  Tish Men, NP ?02/22/22 2007 ? ?

## 2022-02-22 NOTE — Discharge Instructions (Addendum)
Take medication as needed for pain. ?Continue RICE therapy, rest, ice, compression, and elevation as needed. ?Wear the knee brace when you are at work, and with prolonged weightbearing.  This will also help with instability and weakness. ?Your x-ray is normal today.  You will need to have an MRI done to determine if there is any ligamental or tendon injury.  You can follow-up at [facility] at [phone] or [facility] at [phone]. ?Follow-up as needed. ?

## 2022-03-09 ENCOUNTER — Encounter: Payer: Self-pay | Admitting: Physician Assistant

## 2022-03-09 ENCOUNTER — Ambulatory Visit
Admission: EM | Admit: 2022-03-09 | Discharge: 2022-03-09 | Disposition: A | Discharge status: Home / Self Care | Payer: Federal, State, Local not specified - PPO | Attending: Physician Assistant | Admitting: Physician Assistant

## 2022-03-09 DIAGNOSIS — R5383 Other fatigue: Secondary | ICD-10-CM | POA: Diagnosis not present

## 2022-03-09 DIAGNOSIS — J019 Acute sinusitis, unspecified: Secondary | ICD-10-CM | POA: Diagnosis not present

## 2022-03-09 LAB — POCT URINE PREGNANCY: Preg Test, Ur: NEGATIVE

## 2022-03-09 MED ORDER — CEPHALEXIN 250 MG PO CAPS
250.0000 mg | ORAL_CAPSULE | Freq: Three times a day (TID) | ORAL | 0 refills | Status: AC
Start: 2022-03-09 — End: 2022-03-16

## 2022-03-09 NOTE — ED Triage Notes (Signed)
Pt thinks she has sinus infection. C/o headache, sinus pressure for 2 weeks. ?Pt would like a HCG blood test today. She is on birth control and has not miss any pills.  ?

## 2022-03-09 NOTE — ED Provider Notes (Signed)
?[place] ? ? ? ?CSN: [id number] ?Arrival date & time: 03/09/22  1430 ? ? ?  ? ?History   ?Chief Complaint ?Chief Complaint  ?Patient presents with  ? Headache  ? ? ?HPI ?Sara Ford is a 21 y.o. female.  ? ?Patient here today for evaluation of possible sinus infection.  She reports that she has had significant sinus pressure in her frontal and maxillary areas as well as headaches for the last 2 weeks. ? ?She also request hCG quantitative testing today as well as other lab work.  She notes that she has had anemia in the past and notes that she has been having some dizziness, nausea, vomiting and fatigue.  She states that she has also had negative urine pregnancy test and had positive hCG quant test while on birth control in the past.  She is currently taking birth control and has not missed any doses. ? ?The history is provided by the patient.  ?Headache ?Associated symptoms: congestion, dizziness, fatigue, nausea, sinus pressure and vomiting   ?Associated symptoms: no abdominal pain, no diarrhea and no fever   ? ?Past Medical History:  ?Diagnosis Date  ? Psoriasis   ? ? ?There are no problems to display for this patient. ? ? ?History reviewed. No pertinent surgical history. ? ?OB History   ?No obstetric history on file. ?  ? ? ? ?Home Medications   ? ?Prior to Admission medications   ?Medication Sig Start Date End Date Taking? Authorizing Provider  ?cephALEXin (KEFLEX) 250 MG capsule Take 1 capsule (250 mg total) by mouth 3 (three) times daily for 7 days. 03/09/22 03/16/22 Yes Francene Finders, PA-C  ?doxycycline (VIBRA-TABS) 100 MG tablet Take 1 tablet (100 mg total) by mouth 2 (two) times daily. 01/08/22   Mar Daring, PA-C  ?famotidine (PEPCID) 40 MG tablet Take 1 tablet (40 mg total) by mouth daily. 05/26/21   Raspet, Derry Skill, PA-C  ?omeprazole (PRILOSEC) 20 MG capsule Take 1 capsule (20 mg total) by mouth daily. 11/23/21   Hazel Sams, PA-C  ? ? ?Family History ?Family History   ?Problem Relation Age of Onset  ? Colon cancer Mother   ? ? ?Social History ?Social History  ? ?Tobacco Use  ? Smoking status: Never  ? Smokeless tobacco: Never  ?Substance Use Topics  ? Alcohol use: Not Currently  ? Drug use: Not Currently  ? ? ? ?Allergies   ?Contrast media [iodinated contrast media] and Penicillins ? ? ?Review of Systems ?Review of Systems  ?Constitutional:  Positive for fatigue. Negative for chills and fever.  ?HENT:  Positive for congestion and sinus pressure.   ?Eyes:  Negative for discharge and redness.  ?Gastrointestinal:  Positive for nausea and vomiting. Negative for abdominal pain and diarrhea.  ?Neurological:  Positive for dizziness and headaches.  ? ? ?Physical Exam ?Triage Vital Signs ?ED Triage Vitals [03/09/22 1556]  ?Enc Vitals Group  ?   BP 131/85  ?   Pulse Rate 65  ?   Resp 16  ?   Temp 98 ?F (36.7 ?C)  ?   Temp Source Oral  ?   SpO2 97 %  ?   Weight   ?   Height   ?   Head Circumference   ?   Peak Flow   ?   Pain Score   ?   Pain Loc   ?   Pain Edu?   ?   Excl. in [place]?   ? ?  No data found. ? ?Updated Vital Signs ?BP 131/85 (BP Location: Left Arm)   Pulse 65   Temp 98 ?F (36.7 ?C) (Oral)   Resp 16   LMP 01/06/2022   SpO2 97%  ?   ? ?Physical Exam ?Vitals and nursing note reviewed.  ?Constitutional:   ?   General: She is not in acute distress. ?   Appearance: Normal appearance. She is not ill-appearing.  ?HENT:  ?   Head: Normocephalic and atraumatic.  ?   Nose: Congestion present.  ?Eyes:  ?   Conjunctiva/sclera: Conjunctivae normal.  ?Cardiovascular:  ?   Rate and Rhythm: Normal rate.  ?Pulmonary:  ?   Effort: Pulmonary effort is normal.  ?Neurological:  ?   Mental Status: She is alert.  ?Psychiatric:     ?   Mood and Affect: Mood normal.     ?   Behavior: Behavior normal.     ?   Thought Content: Thought content normal.  ? ? ? ?UC Treatments / Results  ?Labs ?(all labs ordered are listed, but only abnormal results are displayed) ?Labs Reviewed  ?CBC WITH  DIFFERENTIAL/PLATELET  ?COMPREHENSIVE METABOLIC PANEL  ?BETA HCG QUANT (REF LAB)  ?POCT URINE PREGNANCY  ? ? ?EKG ? ? ?Radiology ?No results found. ? ?Procedures ?Procedures (including critical care time) ? ?Medications Ordered in UC ?Medications - No data to display ? ?Initial Impression / Assessment and Plan / UC Course  ?I have reviewed the triage vital signs and the nursing notes. ? ?Pertinent labs & imaging results that were available during my care of the patient were reviewed by me and considered in my medical decision making (see chart for details). ? ?  ?Keflex prescribed for suspected sinusitis given penicillin allergy.  Labs ordered as requested.  Will await results for further recommendation. ? ?Final Clinical Impressions(s) / UC Diagnoses  ? ?Final diagnoses:  ?Acute sinusitis, recurrence not specified, unspecified location  ?Other fatigue  ? ?Discharge Instructions   ?None ?  ? ?ED Prescriptions   ? ? Medication Sig Dispense Auth. Provider  ? cephALEXin (KEFLEX) 250 MG capsule Take 1 capsule (250 mg total) by mouth 3 (three) times daily for 7 days. 28 capsule Francene Finders, PA-C  ? ?  ? ?PDMP not reviewed this encounter. ?  ?Francene Finders, PA-C ?03/09/22 1752 ? ?

## 2022-03-10 LAB — COMPREHENSIVE METABOLIC PANEL
ALT: 19 IU/L (ref 0–32)
AST: 23 IU/L (ref 0–40)
Albumin/Globulin Ratio: 1.4 (ref 1.2–2.2)
Albumin: 4.2 g/dL (ref 3.9–5.0)
Alkaline Phosphatase: 80 IU/L (ref 44–121)
BUN/Creatinine Ratio: 15 (ref 9–23)
BUN: 12 mg/dL (ref 6–20)
Bilirubin Total: 0.3 mg/dL (ref 0.0–1.2)
CO2: 22 mmol/L (ref 20–29)
Calcium: 9.2 mg/dL (ref 8.7–10.2)
Chloride: 103 mmol/L (ref 96–106)
Creatinine, Ser: 0.78 mg/dL (ref 0.57–1.00)
Globulin, Total: 3.1 g/dL (ref 1.5–4.5)
Glucose: 100 mg/dL — ABNORMAL HIGH (ref 70–99)
Potassium: 4.2 mmol/L (ref 3.5–5.2)
Sodium: 141 mmol/L (ref 134–144)
Total Protein: 7.3 g/dL (ref 6.0–8.5)
eGFR: 111 mL/min/{1.73_m2} (ref >59)

## 2022-03-10 LAB — CBC WITH DIFFERENTIAL/PLATELET
Basophils Absolute: 0.1 10*3/uL (ref 0.0–0.2)
Basos: 1 %
EOS (ABSOLUTE): 0.7 10*3/uL — ABNORMAL HIGH (ref 0.0–0.4)
Eos: 7 %
Hematocrit: 37.9 % (ref 34.0–46.6)
Hemoglobin: 12.9 g/dL (ref 11.1–15.9)
Immature Grans (Abs): 0 10*3/uL (ref 0.0–0.1)
Immature Granulocytes: 0 %
Lymphocytes Absolute: 2.8 10*3/uL (ref 0.7–3.1)
Lymphs: 28 %
MCH: 30.6 pg (ref 26.6–33.0)
MCHC: 34 g/dL (ref 31.5–35.7)
MCV: 90 fL (ref 79–97)
Monocytes Absolute: 1 10*3/uL — ABNORMAL HIGH (ref 0.1–0.9)
Monocytes: 10 %
Neutrophils Absolute: 5.5 10*3/uL (ref 1.4–7.0)
Neutrophils: 54 %
Platelets: 349 10*3/uL (ref 150–450)
RBC: 4.21 x10E6/uL (ref 3.77–5.28)
RDW: 12.2 % (ref 11.7–15.4)
WBC: 10.2 10*3/uL (ref 3.4–10.8)

## 2022-03-10 LAB — BETA HCG QUANT (REF LAB): hCG Quant: <1 m[IU]/mL

## 2022-04-16 ENCOUNTER — Ambulatory Visit
Admission: EM | Admit: 2022-04-16 | Discharge: 2022-04-16 | Disposition: A | Discharge status: Home / Self Care | Payer: Federal, State, Local not specified - PPO | Attending: Internal Medicine | Admitting: Internal Medicine

## 2022-04-16 DIAGNOSIS — N898 Other specified noninflammatory disorders of vagina: Secondary | ICD-10-CM | POA: Insufficient documentation

## 2022-04-16 DIAGNOSIS — Z3202 Encounter for pregnancy test, result negative: Secondary | ICD-10-CM | POA: Diagnosis not present

## 2022-04-16 DIAGNOSIS — Z113 Encounter for screening for infections with a predominantly sexual mode of transmission: Secondary | ICD-10-CM | POA: Diagnosis not present

## 2022-04-16 LAB — POCT URINE PREGNANCY: Preg Test, Ur: NEGATIVE

## 2022-04-16 NOTE — ED Triage Notes (Signed)
Pt c/o vaginal discharge, mild odor  Denies frequency, pelvic, lower back pain, hematuria, dysuria   Onset ~ 2 months ago

## 2022-04-16 NOTE — Discharge Instructions (Signed)
Urine pregnancy test was negative.  Your vaginal swab is pending.  We will call if results are positive and treat as appropriate.  Please refrain from sexual activity until test results and treatment are complete

## 2022-04-16 NOTE — ED Provider Notes (Signed)
[facility]    CSN: [id number] Arrival date & time: 04/16/22  1831      History   Chief Complaint Chief Complaint  Patient presents with   Vaginal Discharge    HPI Sara Ford is a 21 y.o. female.   Patient presents with a thick, white discharge that has been present daily for the past 2 months.  Denies any associated dysuria, urinary frequency, pelvic pain, hematuria, abnormal vaginal bleeding, abdominal pain, fever, back pain.  Denies any known exposure to STD.  Last menstrual cycle was approximately 2 months ago but patient reports that her menstrual cycles are irregular at times.   Vaginal Discharge   Past Medical History:  Diagnosis Date   Psoriasis     There are no problems to display for this patient.   History reviewed. No pertinent surgical history.  OB History   No obstetric history on file.      Home Medications    Prior to Admission medications   Medication Sig Start Date End Date Taking? Authorizing Provider  doxycycline (VIBRA-TABS) 100 MG tablet Take 1 tablet (100 mg total) by mouth 2 (two) times daily. 01/08/22   Margaretann Loveless, PA-C  famotidine (PEPCID) 40 MG tablet Take 1 tablet (40 mg total) by mouth daily. 05/26/21   Raspet, Noberto Retort, PA-C  omeprazole (PRILOSEC) 20 MG capsule Take 1 capsule (20 mg total) by mouth daily. 11/23/21   Rhys Martini, PA-C    Family History Family History  Problem Relation Age of Onset   Colon cancer Mother     Social History Social History   Tobacco Use   Smoking status: Never   Smokeless tobacco: Never  Substance Use Topics   Alcohol use: Not Currently   Drug use: Not Currently     Allergies   Contrast media [iodinated contrast media] and Penicillins   Review of Systems Review of Systems Per HPI  Physical Exam Triage Vital Signs ED Triage Vitals  Enc Vitals Group     BP 04/16/22 1855 108/76     Pulse Rate 04/16/22 1855 85     Resp 04/16/22 1855 18     Temp  04/16/22 1855 98.3 F (36.8 C)     Temp Source 04/16/22 1855 Oral     SpO2 04/16/22 1855 98 %     Weight --      Height --      Head Circumference --      Peak Flow --      Pain Score 04/16/22 1904 0     Pain Loc --      Pain Edu? --      Excl. in [place]? --    No data found.  Updated Vital Signs BP 108/76 (BP Location: Left Arm)   Pulse 85   Temp 98.3 F (36.8 C) (Oral)   Resp 18   SpO2 98%   Visual Acuity Right Eye Distance:   Left Eye Distance:   Bilateral Distance:    Right Eye Near:   Left Eye Near:    Bilateral Near:     Physical Exam Constitutional:      General: She is not in acute distress.    Appearance: Normal appearance. She is not toxic-appearing or diaphoretic.  HENT:     Head: Normocephalic and atraumatic.  Eyes:     Extraocular Movements: Extraocular movements intact.     Conjunctiva/sclera: Conjunctivae normal.  Pulmonary:     Effort: Pulmonary effort is normal.  Genitourinary:    Comments: Patient declined.  Self swab performed. Neurological:     General: No focal deficit present.     Mental Status: She is alert and oriented to person, place, and time. Mental status is at baseline.  Psychiatric:        Mood and Affect: Mood normal.        Behavior: Behavior normal.        Thought Content: Thought content normal.        Judgment: Judgment normal.     UC Treatments / Results  Labs (all labs ordered are listed, but only abnormal results are displayed) Labs Reviewed  POCT URINE PREGNANCY  CERVICOVAGINAL ANCILLARY ONLY    EKG   Radiology No results found.  Procedures Procedures (including critical care time)  Medications Ordered in UC Medications - No data to display  Initial Impression / Assessment and Plan / UC Course  I have reviewed the triage vital signs and the nursing notes.  Pertinent labs & imaging results that were available during my care of the patient were reviewed by me and considered in my medical decision making  (see chart for details).     Differential diagnoses include STD versus bacterial vaginosis versus vaginal yeast.  Cervicovaginal swab pending.  Will await results for any treatment at this time.  Patient open to STD testing.  Patient declined vaginal exam and I do think this is reasonable as there is no pelvic pain or concern for PID.  Pregnancy was negative.  Patient to follow-up with gynecology due to missed menstrual cycle and irregular periods.  Patient denies concern for pregnancy.  Discussed strict return and ER precautions.  Patient to refrain from sexual activity until test results and treatment are complete.  Patient verbalized understanding and was agreeable with plan. Final Clinical Impressions(s) / UC Diagnoses   Final diagnoses:  Vaginal discharge  Screening examination for venereal disease  Urine pregnancy test negative     Discharge Instructions      Urine pregnancy test was negative.  Your vaginal swab is pending.  We will call if results are positive and treat as appropriate.  Please refrain from sexual activity until test results and treatment are complete     ED Prescriptions   None    PDMP not reviewed this encounter.   Gustavus Bryant, [place] 04/16/22 1920

## 2022-04-19 LAB — CERVICOVAGINAL ANCILLARY ONLY
Bacterial Vaginitis (gardnerella): POSITIVE — AB
Candida Glabrata: NEGATIVE
Candida Vaginitis: POSITIVE — AB
Chlamydia: NEGATIVE
Comment: NEGATIVE
Comment: NEGATIVE
Comment: NEGATIVE
Comment: NEGATIVE
Comment: NEGATIVE
Comment: NORMAL
Neisseria Gonorrhea: NEGATIVE
Trichomonas: NEGATIVE

## 2022-04-20 ENCOUNTER — Telehealth (HOSPITAL_COMMUNITY): Payer: Self-pay | Admitting: Emergency Medicine

## 2022-04-20 MED ORDER — FLUCONAZOLE 150 MG PO TABS: 150.0000 mg | ORAL_TABLET | Freq: Once | ORAL | 0 refills | Status: AC

## 2022-04-20 MED ORDER — METRONIDAZOLE 500 MG PO TABS: 500.0000 mg | ORAL_TABLET | Freq: Two times a day (BID) | ORAL | 0 refills | Status: DC

## 2022-04-25 ENCOUNTER — Encounter (HOSPITAL_COMMUNITY): Payer: Self-pay | Admitting: Emergency Medicine

## 2022-04-25 ENCOUNTER — Other Ambulatory Visit: Payer: Self-pay

## 2022-04-25 ENCOUNTER — Emergency Department (HOSPITAL_COMMUNITY)
Admission: EM | Admit: 2022-04-25 | Discharge: 2022-04-26 | Disposition: A | Discharge status: Home / Self Care | Payer: Federal, State, Local not specified - PPO | Attending: Emergency Medicine | Admitting: Emergency Medicine

## 2022-04-25 DIAGNOSIS — R200 Anesthesia of skin: Secondary | ICD-10-CM | POA: Diagnosis not present

## 2022-04-25 DIAGNOSIS — M79601 Pain in right arm: Secondary | ICD-10-CM | POA: Insufficient documentation

## 2022-04-25 NOTE — ED Provider Notes (Signed)
[facility] EMERGENCY DEPARTMENT Provider Note   CSN: [id number] Arrival date & time: 04/25/22  2109     History  Chief Complaint  Patient presents with   Arm Problem    Sara Ford is a 21 y.o. female with no reported past medical history presenting today with intermittent right arm nerve pain.  Says that it usually happens in the middle of the night when she is trying to sleep.  This has been going on for 3 months.  Today the discomfort was more severe in the right fourth and fifth digits.  She says usually when this happens her extremity becomes weak but she is denying weakness at this time.  No associated blurred vision, lower extremity concerns, sensation deficit, slurred speech or headaches.  Has not tried any medications and has not been established with a PCP because she said "it was going to take too long."  HPI     Home Medications Prior to Admission medications   Medication Sig Start Date End Date Taking? Authorizing Provider  doxycycline (VIBRA-TABS) 100 MG tablet Take 1 tablet (100 mg total) by mouth 2 (two) times daily. 01/08/22   Margaretann Loveless, PA-C  famotidine (PEPCID) 40 MG tablet Take 1 tablet (40 mg total) by mouth daily. 05/26/21   Raspet, Noberto Retort, PA-C  metroNIDAZOLE (FLAGYL) 500 MG tablet Take 1 tablet (500 mg total) by mouth 2 (two) times daily. 04/20/22   Merrilee Jansky, MD  omeprazole (PRILOSEC) 20 MG capsule Take 1 capsule (20 mg total) by mouth daily. 11/23/21   Rhys Martini, PA-C      Allergies    Contrast media [iodinated contrast media] and Penicillins    Review of Systems   Review of Systems  Physical Exam Updated Vital Signs BP 107/86   Pulse 92   Temp 99.2 F (37.3 C) (Oral)   Resp 18   SpO2 99%  Physical Exam Vitals and nursing note reviewed.  Constitutional:      Appearance: Normal appearance.  HENT:     Head: Normocephalic and atraumatic.  Eyes:     General: No scleral icterus.     Conjunctiva/sclera: Conjunctivae normal.  Pulmonary:     Effort: Pulmonary effort is normal. No respiratory distress.  Musculoskeletal:     Comments: Full range of motion of bilateral upper extremities at the shoulder, elbow, wrist and MCPs.  No sensation deficits however patient reports pain over the fourth and fifth digits of the right hand.  Worse with shoulder extension.  Skin:    Findings: No rash.  Neurological:     Mental Status: She is alert.  Psychiatric:        Mood and Affect: Mood normal.    ED Results / Procedures / Treatments   Labs (all labs ordered are listed, but only abnormal results are displayed) Labs Reviewed - No data to display  EKG None  Radiology No results found.  Procedures Procedures   Medications Ordered in ED Medications - No data to display  ED Course/ Medical Decision Making/ A&P                           Medical Decision Making  21 year old female presenting with intermittent right arm numbness for 3 months.  Reports that it wakes her up at night.  She believes she sleeps on her back but may sometimes lay on her arm.  Physical exam: Neurovascularly intact.  Pain seems  to be worse with right shoulder extension.  MDM/disposition: Patient has never been seen for this chronic problem.  This been going on for multiple months.  There is an absence of other symptoms suspicious for CVA/other intracranial abnormality.  Do not believe she needs further work-up at this time, especially being that she is 21 with no known medical problems.  She is already established with [facility] for an ankle injury and says that she follows with them closely and would be willing to speak with them about this problem outpatient.  She was also encouraged to get established with a primary care provider and to obtain an appointment even if it is not for a couple weeks/months.  She is agreeable.  Given referrals.  We discussed important return precautions, especially signs  and symptoms of CVA.  I am more suspicious for radial/ulnar nerve etiology for her pain.  She and her boyfriend are agreeable to discharge and voiced understanding of return precautions.   Final Clinical Impression(s) / ED Diagnoses Final diagnoses:  Right arm pain    Rx / DC Orders   Results and diagnoses were explained to the patient. Return precautions discussed in full. Patient had no additional questions and expressed complete understanding.   This chart was dictated using voice recognition software.  Despite best efforts to proofread,  errors can occur which can change the documentation meaning.    Saddie Benders, PA-C 04/26/22 0040    Nira Conn, MD 04/26/22 [phone]

## 2022-04-25 NOTE — ED Triage Notes (Addendum)
Pt reported to ED with c/o numbness and swelling to right arm that has been occurring intermittently for the past three months but increased in severity today. Pt states that she has not injured arm and feels as if she has a pinched nerve. Has not been evaluated by PCP for the same stating "I thought it was normal".

## 2022-04-25 NOTE — Discharge Instructions (Addendum)
Follow-up with [facility] about your arm issue.  I have attached multiple primary care offices that you may call to get established as a new patient.  Return with any worsening symptoms, especially any blurred vision, severe and sudden onset headache, slurred speech or inability to move your extremities.

## 2022-04-26 NOTE — ED Notes (Signed)
Patient verbalizes understanding of discharge instructions. Opportunity for questioning and answers were provided. Armband removed by staff, pt discharged from ED ambulatory.   

## 2022-05-14 ENCOUNTER — Encounter: Payer: Self-pay | Admitting: Physician Assistant

## 2022-06-06 ENCOUNTER — Ambulatory Visit
Admission: EM | Admit: 2022-06-06 | Discharge: 2022-06-06 | Disposition: A | Discharge status: Home / Self Care | Payer: Federal, State, Local not specified - PPO | Attending: Physician Assistant | Admitting: Physician Assistant

## 2022-06-06 DIAGNOSIS — Z202 Contact with and (suspected) exposure to infections with a predominantly sexual mode of transmission: Secondary | ICD-10-CM

## 2022-06-06 DIAGNOSIS — J029 Acute pharyngitis, unspecified: Secondary | ICD-10-CM | POA: Diagnosis not present

## 2022-06-06 DIAGNOSIS — Z113 Encounter for screening for infections with a predominantly sexual mode of transmission: Secondary | ICD-10-CM | POA: Diagnosis not present

## 2022-06-06 DIAGNOSIS — H9203 Otalgia, bilateral: Secondary | ICD-10-CM | POA: Insufficient documentation

## 2022-06-06 DIAGNOSIS — Z3202 Encounter for pregnancy test, result negative: Secondary | ICD-10-CM

## 2022-06-06 LAB — POCT URINE PREGNANCY: Preg Test, Ur: NEGATIVE

## 2022-06-06 MED ORDER — CETIRIZINE-PSEUDOEPHEDRINE ER 5-120 MG PO TB12: 1.0000 | ORAL_TABLET | Freq: Two times a day (BID) | ORAL | 0 refills | Status: DC

## 2022-06-06 NOTE — Discharge Instructions (Addendum)
Return if any problems.

## 2022-06-06 NOTE — ED Triage Notes (Signed)
Pt presents with bilateral ear pain X 1 month and sore throat X 1 week; pt also requesting pregnancy and STD testing.

## 2022-06-07 LAB — CERVICOVAGINAL ANCILLARY ONLY
Chlamydia: NEGATIVE
Comment: NEGATIVE
Comment: NEGATIVE
Comment: NORMAL
Neisseria Gonorrhea: NEGATIVE
Trichomonas: NEGATIVE

## 2022-06-07 NOTE — ED Provider Notes (Signed)
[facility]    CSN: [id number] Arrival date & time: 06/06/22  1242      History   Chief Complaint Chief Complaint  Patient presents with   Sore Throat   Otalgia    HPI Sara Ford is a 21 y.o. female.   Patient complains of bilateral ear pain for the past month patient also complains of a sore throat and nasal discomfort.  Patient also request STD testing and a urine pregnancy.  Patient denies any fever or chills she has not had any cough   Sore Throat  Otalgia   Past Medical History:  Diagnosis Date   Psoriasis     There are no problems to display for this patient.   History reviewed. No pertinent surgical history.  OB History   No obstetric history on file.      Home Medications    Prior to Admission medications   Medication Sig Start Date End Date Taking? Authorizing Provider  cetirizine-pseudoephedrine (ZYRTEC-D) 5-120 MG tablet Take 1 tablet by mouth 2 (two) times daily. 06/06/22  Yes Cheron Schaumann K, PA-C  famotidine (PEPCID) 40 MG tablet Take 1 tablet (40 mg total) by mouth daily. 05/26/21   Raspet, Noberto Retort, PA-C  omeprazole (PRILOSEC) 20 MG capsule Take 1 capsule (20 mg total) by mouth daily. 11/23/21   Rhys Martini, PA-C    Family History Family History  Problem Relation Age of Onset   Colon cancer Mother     Social History Social History   Tobacco Use   Smoking status: Never   Smokeless tobacco: Never  Substance Use Topics   Alcohol use: Not Currently   Drug use: Not Currently     Allergies   Contrast media [iodinated contrast media] and Penicillins   Review of Systems Review of Systems  HENT:  Positive for ear pain.   All other systems reviewed and are negative.    Physical Exam Triage Vital Signs ED Triage Vitals  Enc Vitals Group     BP 06/06/22 1332 108/76     Pulse Rate 06/06/22 1332 84     Resp 06/06/22 1332 18     Temp 06/06/22 1332 98.7 F (37.1 C)     Temp Source 06/06/22 1332 Oral     SpO2  06/06/22 1332 98 %     Weight --      Height --      Head Circumference --      Peak Flow --      Pain Score 06/06/22 1335 5     Pain Loc --      Pain Edu? --      Excl. in GC? --    No data found.  Updated Vital Signs BP 108/76 (BP Location: Left Arm)   Pulse 84   Temp 98.7 F (37.1 C) (Oral)   Resp 18   LMP 05/11/2022   SpO2 98%   Visual Acuity Right Eye Distance:   Left Eye Distance:   Bilateral Distance:    Right Eye Near:   Left Eye Near:    Bilateral Near:     Physical Exam Vitals and nursing note reviewed.  Constitutional:      Appearance: She is well-developed.  HENT:     Head: Normocephalic.     Right Ear: Tympanic membrane normal.     Left Ear: Tympanic membrane normal.     Mouth/Throat:     Mouth: Mucous membranes are moist.  Pulmonary:  Effort: Pulmonary effort is normal.  Abdominal:     General: There is no distension.  Musculoskeletal:        General: Normal range of motion.     Cervical back: Normal range of motion.  Skin:    General: Skin is warm.     Capillary Refill: Capillary refill takes less than 2 seconds.  Neurological:     Mental Status: She is alert and oriented to person, place, and time.      UC Treatments / Results  Labs (all labs ordered are listed, but only abnormal results are displayed) Labs Reviewed  POCT URINE PREGNANCY  CERVICOVAGINAL ANCILLARY ONLY    EKG   Radiology No results found.  Procedures Procedures (including critical care time)  Medications Ordered in UC Medications - No data to display  Initial Impression / Assessment and Plan / UC Course  I have reviewed the triage vital signs and the nursing notes.  Pertinent labs & imaging results that were available during my care of the patient were reviewed by me and considered in my medical decision making (see chart for details).     Medical decision making patient has a normal examination bilateral TMs are clear.  I did not see any evidence of  infection patient is advised to try Zyrtec daily for symptoms urine pregnancy is negative STD test are pending Final Clinical Impressions(s) / UC Diagnoses   Final diagnoses:  Otalgia of both ears     Discharge Instructions      Return if any problems.     ED Prescriptions     Medication Sig Dispense Auth. Provider   cetirizine-pseudoephedrine (ZYRTEC-D) 5-120 MG tablet Take 1 tablet by mouth 2 (two) times daily. 30 tablet Elson Areas, [place]      PDMP not reviewed this encounter.   Elson Areas, [place] 06/07/22 1802

## 2022-07-22 ENCOUNTER — Ambulatory Visit
Admission: EM | Admit: 2022-07-22 | Discharge: 2022-07-22 | Disposition: A | Discharge status: Home / Self Care | Payer: Federal, State, Local not specified - PPO | Attending: Family Medicine | Admitting: Family Medicine

## 2022-07-22 ENCOUNTER — Ambulatory Visit (INDEPENDENT_AMBULATORY_CARE_PROVIDER_SITE_OTHER): Payer: Federal, State, Local not specified - PPO

## 2022-07-22 DIAGNOSIS — M25572 Pain in left ankle and joints of left foot: Secondary | ICD-10-CM

## 2022-07-22 NOTE — ED Provider Notes (Signed)
[facility]    CSN: [id number] Arrival date & time: 07/22/22  1531      History   Chief Complaint Chief Complaint  Patient presents with   Ankle Pain    HPI Sara Ford is a 21 y.o. female.    Ankle Pain  Here for left lateral ankle pain.  2 days ago she had a pallet of wood fall onto her left ankle.  It actually has had a little less swelling today than it had yesterday.  Not taken any over-the-counter medicine yet for it Past Medical History:  Diagnosis Date   Psoriasis     There are no problems to display for this patient.   No past surgical history on file.  OB History   No obstetric history on file.      Home Medications    Prior to Admission medications   Medication Sig Start Date End Date Taking? Authorizing Provider  cetirizine-pseudoephedrine (ZYRTEC-D) 5-120 MG tablet Take 1 tablet by mouth 2 (two) times daily. 06/06/22   Elson Areas, PA-C  famotidine (PEPCID) 40 MG tablet Take 1 tablet (40 mg total) by mouth daily. 05/26/21   Raspet, Noberto Retort, PA-C  omeprazole (PRILOSEC) 20 MG capsule Take 1 capsule (20 mg total) by mouth daily. 11/23/21   Rhys Martini, PA-C    Family History Family History  Problem Relation Age of Onset   Colon cancer Mother     Social History Social History   Tobacco Use   Smoking status: Never   Smokeless tobacco: Never  Substance Use Topics   Alcohol use: Not Currently   Drug use: Not Currently     Allergies   Contrast media [iodinated contrast media] and Penicillins   Review of Systems Review of Systems   Physical Exam Triage Vital Signs ED Triage Vitals  Enc Vitals Group     BP 07/22/22 1605 113/80     Pulse Rate 07/22/22 1605 72     Resp 07/22/22 1605 18     Temp 07/22/22 1605 98 F (36.7 C)     Temp Source 07/22/22 1605 Oral     SpO2 07/22/22 1605 96 %     Weight --      Height --      Head Circumference --      Peak Flow --      Pain Score 07/22/22 1604 8     Pain Loc --       Pain Edu? --      Excl. in GC? --    No data found.  Updated Vital Signs BP 113/80 (BP Location: Left Arm)   Pulse 72   Temp 98 F (36.7 C) (Oral)   Resp 18   SpO2 96%   Visual Acuity Right Eye Distance:   Left Eye Distance:   Bilateral Distance:    Right Eye Near:   Left Eye Near:    Bilateral Near:     Physical Exam Vitals reviewed.  Constitutional:      General: She is not in acute distress.    Appearance: She is not ill-appearing, toxic-appearing or diaphoretic.  Musculoskeletal:     Comments: There is some tenderness and swelling over the lateral malleolus on the left and also inferior and anterior to the lateral malleolus.  Neurological:     Mental Status: She is alert and oriented to person, place, and time.  Psychiatric:        Behavior: Behavior normal.  UC Treatments / Results  Labs (all labs ordered are listed, but only abnormal results are displayed) Labs Reviewed - No data to display  EKG   Radiology DG Ankle Complete Left  Result Date: 07/22/2022 CLINICAL DATA:  Ankle pain EXAM: LEFT ANKLE COMPLETE - 3 VIEW COMPARISON:  None Available. FINDINGS: There is no evidence of fracture, dislocation, or joint effusion. No evidence of arthropathy or other focal bone abnormality. Mild soft tissue swelling, most pronounced at the lateral malleolus. IMPRESSION: No acute osseous abnormality. Electronically Signed   By: Allegra Lai M.D.   On: 07/22/2022 16:44    Procedures Procedures (including critical care time)  Medications Ordered in UC Medications - No data to display  Initial Impression / Assessment and Plan / UC Course  I have reviewed the triage vital signs and the nursing notes.  Pertinent labs & imaging results that were available during my care of the patient were reviewed by me and considered in my medical decision making (see chart for details).     Her ankle x-ray is negative.  She declined the Toradol shot.  She has ibuprofen  at home.  She will ice and elevate it today and tomorrow. Final Clinical Impressions(s) / UC Diagnoses   Final diagnoses:  Acute left ankle pain     Discharge Instructions      Your x-rays were negative for broken bones  Take Tylenol or ibuprofen as needed.  Elevate and ice your ankle today and tomorrow     ED Prescriptions   None    I have reviewed the PDMP during this encounter.   Zenia Resides, MD 07/22/22 [phone]

## 2022-07-22 NOTE — Discharge Instructions (Addendum)
Your x-rays were negative for broken bones  Take Tylenol or ibuprofen as needed.  Elevate and ice your ankle today and tomorrow

## 2022-07-22 NOTE — ED Triage Notes (Signed)
Pt presents with left ankle injury after a pallet of wood fell on her ankle X 2 days ago.

## 2022-07-30 ENCOUNTER — Ambulatory Visit
Admission: RE | Admit: 2022-07-30 | Discharge: 2022-07-30 | Disposition: A | Discharge status: Home / Self Care | Payer: Federal, State, Local not specified - PPO | Source: Ambulatory Visit

## 2022-07-30 VITALS — BP 104/72 | HR 105 | Temp 98.1°F | Resp 16

## 2022-07-30 DIAGNOSIS — S99912A Unspecified injury of left ankle, initial encounter: Secondary | ICD-10-CM

## 2022-07-30 NOTE — ED Provider Notes (Signed)
Patient here today because her employer is needing a signed note stating she does not have restrictions. No restrictions were given, note provided regarding same and signed as requested. Patient is doing well and does not have any concerns with recent injury.    Tomi Bamberger, PA-C 07/30/22 1552

## 2022-07-30 NOTE — ED Triage Notes (Signed)
Pt states she needs a work note after having a workman's comp injury.

## 2022-09-05 ENCOUNTER — Encounter (HOSPITAL_COMMUNITY): Payer: Self-pay

## 2022-09-05 ENCOUNTER — Emergency Department (HOSPITAL_COMMUNITY): Payer: Federal, State, Local not specified - PPO

## 2022-09-05 ENCOUNTER — Emergency Department (HOSPITAL_COMMUNITY)
Admission: EM | Admit: 2022-09-05 | Discharge: 2022-09-05 | Disposition: A | Discharge status: Home / Self Care | Payer: Federal, State, Local not specified - PPO | Attending: Emergency Medicine | Admitting: Emergency Medicine

## 2022-09-05 ENCOUNTER — Other Ambulatory Visit: Payer: Self-pay

## 2022-09-05 DIAGNOSIS — R569 Unspecified convulsions: Secondary | ICD-10-CM | POA: Insufficient documentation

## 2022-09-05 LAB — CBC WITH DIFFERENTIAL/PLATELET
Abs Immature Granulocytes: 0.08 10*3/uL — ABNORMAL HIGH (ref 0.00–0.07)
Basophils Absolute: 0 10*3/uL (ref 0.0–0.1)
Basophils Relative: 0 %
Eosinophils Absolute: 0.2 10*3/uL (ref 0.0–0.5)
Eosinophils Relative: 1 %
HCT: 40.9 % (ref 36.0–46.0)
Hemoglobin: 13.1 g/dL (ref 12.0–15.0)
Immature Granulocytes: 1 %
Lymphocytes Relative: 26 %
Lymphs Abs: 3.1 10*3/uL (ref 0.7–4.0)
MCH: 30.5 pg (ref 26.0–34.0)
MCHC: 32 g/dL (ref 30.0–36.0)
MCV: 95.3 fL (ref 80.0–100.0)
Monocytes Absolute: 1.2 10*3/uL — ABNORMAL HIGH (ref 0.1–1.0)
Monocytes Relative: 10 %
Neutro Abs: 7.2 10*3/uL (ref 1.7–7.7)
Neutrophils Relative %: 62 %
Platelets: 311 10*3/uL (ref 150–400)
RBC: 4.29 MIL/uL (ref 3.87–5.11)
RDW: 12 % (ref 11.5–15.5)
WBC: 11.7 10*3/uL — ABNORMAL HIGH (ref 4.0–10.5)
nRBC: 0 % (ref 0.0–0.2)

## 2022-09-05 LAB — I-STAT CHEM 8, ED
BUN: 9 mg/dL (ref 6–20)
Calcium, Ion: 1.24 mmol/L (ref 1.15–1.40)
Chloride: 102 mmol/L (ref 98–111)
Creatinine, Ser: 0.8 mg/dL (ref 0.44–1.00)
Glucose, Bld: 93 mg/dL (ref 70–99)
HCT: 43 % (ref 36.0–46.0)
Hemoglobin: 14.6 g/dL (ref 12.0–15.0)
Potassium: 3.8 mmol/L (ref 3.5–5.1)
Sodium: 140 mmol/L (ref 135–145)
TCO2: 26 mmol/L (ref 22–32)

## 2022-09-05 LAB — COMPREHENSIVE METABOLIC PANEL
ALT: 10 U/L (ref 0–44)
AST: 18 U/L (ref 15–41)
Albumin: 3.9 g/dL (ref 3.5–5.0)
Alkaline Phosphatase: 52 U/L (ref 38–126)
Anion gap: 3 — ABNORMAL LOW (ref 5–15)
BUN: 10 mg/dL (ref 6–20)
CO2: 27 mmol/L (ref 22–32)
Calcium: 8.9 mg/dL (ref 8.9–10.3)
Chloride: 107 mmol/L (ref 98–111)
Creatinine, Ser: 0.79 mg/dL (ref 0.44–1.00)
GFR, Estimated: >60 mL/min (ref >60)
Glucose, Bld: 98 mg/dL (ref 70–99)
Potassium: 3.7 mmol/L (ref 3.5–5.1)
Sodium: 137 mmol/L (ref 135–145)
Total Bilirubin: 0.4 mg/dL (ref 0.3–1.2)
Total Protein: 7.4 g/dL (ref 6.5–8.1)

## 2022-09-05 LAB — ETHANOL: Alcohol, Ethyl (B): <10 mg/dL (ref <10)

## 2022-09-05 LAB — RAPID URINE DRUG SCREEN, HOSP PERFORMED
Amphetamines: NOT DETECTED
Barbiturates: NOT DETECTED
Benzodiazepines: NOT DETECTED
Cocaine: NOT DETECTED
Opiates: NOT DETECTED
Tetrahydrocannabinol: POSITIVE — AB

## 2022-09-05 MED ORDER — LEVETIRACETAM 500 MG PO TABS: 500.0000 mg | ORAL_TABLET | Freq: Two times a day (BID) | ORAL | 0 refills | Status: DC

## 2022-09-05 MED ORDER — LEVETIRACETAM IN NACL 1000 MG/100ML IV SOLN
1000.0000 mg | Freq: Once | INTRAVENOUS | Status: AC
  Administered 2022-09-05: 1000 mg via INTRAVENOUS
  Filled 2022-09-05: qty 100

## 2022-09-05 NOTE — ED Provider Notes (Signed)
[facility] DEPT Provider Note   CSN: [id number] Arrival date & time: 09/05/22  1806     History {Add pertinent medical, surgical, social history, OB history to HPI:1} Chief Complaint  Patient presents with   Seizures    Sara Ford is a 21 y.o. female.  Patient had a seizure today.  She has no other medical problems.  Patient states she has had seizures before but has not been evaluated for them   Seizures      Home Medications Prior to Admission medications   Medication Sig Start Date End Date Taking? Authorizing Provider  cetirizine-pseudoephedrine (ZYRTEC-D) 5-120 MG tablet Take 1 tablet by mouth 2 (two) times daily. Patient not taking: Reported on 09/05/2022 06/06/22   Fransico Meadow, PA-C  famotidine (PEPCID) 40 MG tablet Take 1 tablet (40 mg total) by mouth daily. Patient not taking: Reported on 09/05/2022 05/26/21   Raspet, Derry Skill, PA-C  levETIRAcetam (KEPPRA) 500 MG tablet Take 1 tablet (500 mg total) by mouth 2 (two) times daily. 09/05/22   Milton Ferguson, MD  omeprazole (PRILOSEC) 20 MG capsule Take 1 capsule (20 mg total) by mouth daily. Patient not taking: Reported on 09/05/2022 11/23/21   Hazel Sams, PA-C      Allergies    Contrast media [iodinated contrast media] and Penicillins    Review of Systems   Review of Systems  Neurological:  Positive for seizures.    Physical Exam Updated Vital Signs BP 103/65   Pulse 79   Temp 98.3 F (36.8 C) (Oral)   Resp (!) 22   Ht 5\' 1"  (1.549 m)   Wt 63 kg   SpO2 99%   BMI 26.24 kg/m  Physical Exam  ED Results / Procedures / Treatments   Labs (all labs ordered are listed, but only abnormal results are displayed) Labs Reviewed  CBC WITH DIFFERENTIAL/PLATELET - Abnormal; Notable for the following components:      Result Value   WBC 11.7 (*)    Monocytes Absolute 1.2 (*)    Abs Immature Granulocytes 0.08 (*)    All other components within normal limits  COMPREHENSIVE  METABOLIC PANEL - Abnormal; Notable for the following components:   Anion gap 3 (*)    All other components within normal limits  RAPID URINE DRUG SCREEN, HOSP PERFORMED - Abnormal; Notable for the following components:   Tetrahydrocannabinol POSITIVE (*)    All other components within normal limits  ETHANOL  I-STAT CHEM 8, ED    EKG None  Radiology CT Head Wo Contrast  Result Date: 09/05/2022 CLINICAL DATA:  Persistent or recurrent dizziness. Seizures yesterday and today. EXAM: CT HEAD WITHOUT CONTRAST TECHNIQUE: Contiguous axial images were obtained from the base of the skull through the vertex without intravenous contrast. RADIATION DOSE REDUCTION: This exam was performed according to the departmental dose-optimization program which includes automated exposure control, adjustment of the mA and/or kV according to patient size and/or use of iterative reconstruction technique. COMPARISON:  01/26/2021 FINDINGS: Brain: No evidence of acute infarction, hemorrhage, hydrocephalus, extra-axial collection or mass lesion/mass effect. Vascular: No hyperdense vessel or unexpected calcification. Skull: Normal. Negative for fracture or focal lesion. Sinuses/Orbits: No acute finding. Other: None. IMPRESSION: No acute intracranial abnormalities. Electronically Signed   By: Lucienne Capers M.D.   On: 09/05/2022 19:08    Procedures Procedures  {Document cardiac monitor, telemetry assessment procedure when appropriate:1}  Medications Ordered in ED Medications  levETIRAcetam (KEPPRA) IVPB 1000 mg/100 mL premix (0  mg Intravenous Stopped 09/05/22 2127)    ED Course/ Medical Decision Making/ A&P                           Medical Decision Making Amount and/or Complexity of Data Reviewed Labs: ordered. Radiology: ordered. ECG/medicine tests: ordered.  Risk Prescription drug management.   Patient with recurrent seizures.  We will place her on Keppra and she will follow-up with [facility] neurology.   Patient has been told not to do any driving  {Document critical care time when appropriate:1} {Document review of labs and clinical decision tools ie heart score, Chads2Vasc2 etc:1}  {Document your independent review of radiology images, and any outside records:1} {Document your discussion with family members, caretakers, and with consultants:1} {Document social determinants of health affecting pt's care:1} {Document your decision making why or why not admission, treatments were needed:1} Final Clinical Impression(s) / ED Diagnoses Final diagnoses:  Seizure (HCC)    Rx / DC Orders ED Discharge Orders          Ordered    levETIRAcetam (KEPPRA) 500 MG tablet  2 times daily,   Status:  Discontinued        09/05/22 2229    levETIRAcetam (KEPPRA) 500 MG tablet  2 times daily        09/05/22 2233

## 2022-09-05 NOTE — Discharge Instructions (Addendum)
Do not do any driving.  Call tomorrow for an appointment with [facility] neurology.  Return to the emergency department if you have another seizure.  Start your medication tomorrow

## 2022-09-05 NOTE — ED Triage Notes (Signed)
BIB by EMS for seizure. Per witnesses, pt had 2 back to back seizures, each lasting less than 5 minutes. Pt states she has hx of seizures for about 2 years, is not prescribed medications for this.

## 2022-09-05 NOTE — ED Notes (Signed)
Patient transported to CT 

## 2022-09-15 ENCOUNTER — Ambulatory Visit (INDEPENDENT_AMBULATORY_CARE_PROVIDER_SITE_OTHER): Payer: Federal, State, Local not specified - PPO | Admitting: Neurology

## 2022-09-15 ENCOUNTER — Encounter: Payer: Self-pay | Admitting: Neurology

## 2022-09-15 VITALS — BP 114/77 | HR 101 | Ht 61.5 in | Wt 182.0 lb

## 2022-09-15 DIAGNOSIS — G40909 Epilepsy, unspecified, not intractable, without status epilepticus: Secondary | ICD-10-CM

## 2022-09-15 DIAGNOSIS — Z5181 Encounter for therapeutic drug level monitoring: Secondary | ICD-10-CM | POA: Diagnosis not present

## 2022-09-15 MED ORDER — LEVETIRACETAM 500 MG PO TABS: 500.0000 mg | ORAL_TABLET | Freq: Two times a day (BID) | ORAL | 11 refills | Status: DC

## 2022-09-15 MED ORDER — LEVETIRACETAM 500 MG PO TABS: 500.0000 mg | ORAL_TABLET | Freq: Two times a day (BID) | ORAL | 11 refills | Status: AC

## 2022-09-15 NOTE — Addendum Note (Signed)
Addended byAlric Ran on: 09/15/2022 01:46 PM   Modules accepted: Orders

## 2022-09-15 NOTE — Progress Notes (Signed)
[facility]  PATIENT: Sara Ford DOB: May 15, 2001  REQUESTING CLINICIAN: Milton Ferguson, MD HISTORY FROM: Patient  REASON FOR VISIT: Seizure disorder    HISTORICAL  CHIEF COMPLAINT:  Chief Complaint  Patient presents with   New Patient (Initial Visit)    Rm 15 reports she was dx with seizures 2 years ago. Most recent she was at church on 09/05/2022 and had two seizures back to back. Transported to [facility] ED and advised to f/u with out pt neurology. ED started her on Keppra 500 mg bid (tolerating well.)     HISTORY OF PRESENT ILLNESS:  This is a 21 year old woman with no reported past medical history who is presenting for seizure.  Patient reports that  she had a first seizure 2 years ago, this was after the death of the mother.  Her mom was diagnosed with colon cancer and died in 02-Apr-2020.  Since then she has been having seizures on average once a week.  She reported the seizures sometimes are induced by stress others are not related to stress.  She reports there are also episode where she will stare with right arm shaking and unresponsive and then she have another episode where she will fall and have generalized shaking.  Sometimes, she can tell when she is about to have a seizure because she will start to feel weird, vision gets blurry and hazy. She reports that her last seizure was on 8 October and she had multiple seizures while in church.  She was taken to the ED, loaded with Keppra and started on Keppra 500 mg twice daily.  She tolerates the medication very well, denies any side effect and no additional seizures since being on Keppra.  She denies any seizure risk factors other than her aunt with seizure disorder but denies any history of head trauma.  She reports also that she was adopted    Handedness: Right handed   Onset: 2 years ago   Seizure Type: Generalized convulsion and sometimes it is staring and right arm shaking  Current frequency: Once a week but  none since starting Keppra   Any injuries from seizures: Tongue biting   Seizure risk factors: Aunt   Previous ASMs: None   Currenty ASMs: Levetiracetam 500 Bid   ASMs side effects: None   Brain Images: CT head normal   Previous EEGs: None    OTHER MEDICAL CONDITIONS: None   REVIEW OF SYSTEMS: Full 14 system review of systems performed and negative with exception of: As noted in the HPI   ALLERGIES: Allergies  Allergen Reactions   Contrast Media [Iodinated Contrast Media] Anaphylaxis   Penicillins     HOME MEDICATIONS: Outpatient Medications Prior to Visit  Medication Sig Dispense Refill   cetirizine-pseudoephedrine (ZYRTEC-D) 5-120 MG tablet Take 1 tablet by mouth 2 (two) times daily. (Patient not taking: Reported on 09/05/2022) 30 tablet 0   famotidine (PEPCID) 40 MG tablet Take 1 tablet (40 mg total) by mouth daily. (Patient not taking: Reported on 09/05/2022) 30 tablet 0   levETIRAcetam (KEPPRA) 500 MG tablet Take 1 tablet (500 mg total) by mouth 2 (two) times daily. 60 tablet 0   omeprazole (PRILOSEC) 20 MG capsule Take 1 capsule (20 mg total) by mouth daily. (Patient not taking: Reported on 09/05/2022) 30 capsule 0   No facility-administered medications prior to visit.    PAST MEDICAL HISTORY: Past Medical History:  Diagnosis Date   Psoriasis     PAST SURGICAL HISTORY: History reviewed. No  pertinent surgical history.  FAMILY HISTORY: Family History  Problem Relation Age of Onset   Colon cancer Mother     SOCIAL HISTORY: Social History   Socioeconomic History   Marital status: Single    Spouse name: Not on file   Number of children: Not on file   Years of education: Not on file   Highest education level: Not on file  Occupational History   Not on file  Tobacco Use   Smoking status: Never   Smokeless tobacco: Never  Substance and Sexual Activity   Alcohol use: Not Currently   Drug use: Not Currently   Sexual activity: Not on file  Other Topics  Concern   Not on file  Social History Narrative   Not on file   Social Determinants of Health   Financial Resource Strain: Not on file  Food Insecurity: Not on file  Transportation Needs: Not on file  Physical Activity: Not on file  Stress: Not on file  Social Connections: Not on file  Intimate Partner Violence: Not on file    PHYSICAL EXAM  GENERAL EXAM/CONSTITUTIONAL: Vitals:  Vitals:   09/15/22 0919  BP: 114/77  Pulse: (!) 101  Weight: 182 lb (82.6 kg)  Height: 5' 1.5" (1.562 m)   Body mass index is 33.83 kg/m. Wt Readings from Last 3 Encounters:  09/15/22 182 lb (82.6 kg)  09/05/22 138 lb 14.2 oz (63 kg)  01/26/21 135 lb (61.2 kg) (62 %, Z= 0.29)*   * Growth percentiles are based on CDC (Girls, 2-20 Years) data.   Patient is in no distress; well developed, nourished and groomed; neck is supple  EYES: Pupils round and reactive to light, Visual fields full to confrontation, Extraocular movements intacts,  No results found.  MUSCULOSKELETAL: Gait, strength, tone, movements noted in Neurologic exam below  NEUROLOGIC: MENTAL STATUS:      No data to display         awake, alert, oriented to person, place and time recent and remote memory intact normal attention and concentration language fluent, comprehension intact, naming intact fund of knowledge appropriate  CRANIAL NERVE:  2nd, 3rd, 4th, 6th - pupils equal and reactive to light, visual fields full to confrontation, extraocular muscles intact, no nystagmus 5th - facial sensation symmetric 7th - facial strength symmetric 8th - hearing intact 9th - palate elevates symmetrically, uvula midline 11th - shoulder shrug symmetric 12th - tongue protrusion midline  MOTOR:  normal bulk and tone, full strength in the BUE, BLE  SENSORY:  normal and symmetric to light touch  COORDINATION:  finger-nose-finger, fine finger movements normal  REFLEXES:  deep tendon reflexes present and  symmetric  GAIT/STATION:  normal     DIAGNOSTIC DATA (LABS, IMAGING, TESTING) - I reviewed patient records, labs, notes, testing and imaging myself where available.  Lab Results  Component Value Date   WBC 11.7 (H) 09/05/2022   HGB 14.6 09/05/2022   HCT 43.0 09/05/2022   MCV 95.3 09/05/2022   PLT 311 09/05/2022      Component Value Date/Time   NA 140 09/05/2022 1938   NA 141 03/09/2022 1634   K 3.8 09/05/2022 1938   CL 102 09/05/2022 1938   CO2 27 09/05/2022 1832   GLUCOSE 93 09/05/2022 1938   BUN 9 09/05/2022 1938   BUN 12 03/09/2022 1634   CREATININE 0.80 09/05/2022 1938   CALCIUM 8.9 09/05/2022 1832   PROT 7.4 09/05/2022 1832   PROT 7.3 03/09/2022 1634   ALBUMIN 3.9  09/05/2022 1832   ALBUMIN 4.2 03/09/2022 1634   AST 18 09/05/2022 1832   ALT 10 09/05/2022 1832   ALKPHOS 52 09/05/2022 1832   BILITOT 0.4 09/05/2022 1832   BILITOT 0.3 03/09/2022 1634   GFRNONAA >60 09/05/2022 1832   No results found for: "CHOL", "HDL", "LDLCALC", "LDLDIRECT", "TRIG" No results found for: "HGBA1C" No results found for: "VITAMINB12" No results found for: "TSH"  Head CT 09/05/22 No acute intracranial abnormalities   I personally reviewed brain Images and previous EEG reports.   ASSESSMENT AND PLAN  21 y.o. year old female  with no reported medical history who is presenting to establish care for her seizure disorder.  Patient reports seizure started 2 years ago, and her last seizure was on October 8.  Since then she has been started on Keppra 500 mg twice daily and reported no additional seizures.  At the moment I will continue patient on Keppra 500 mg twice daily and obtain a level. We will also obtain routine EEG.  Advised her to contact me if she has additional seizures, at that time we will most likely go up on the Keppra.  Follow-up in 8-months.  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  She works at [employer]. She voices understanding.   1. Seizure disorder (HCC)   2. Therapeutic drug monitoring     Patient Instructions  Continue with Keppra 500 mg twice daily  Will check a Keppra level today  Routine EEG  Discussed driving restriction for the next 6 months  Return in 6 months or sooner if worse    Per [facility] statutes, patients with seizures are not allowed to drive until they have been seizure-free for six months.  Other recommendations include using caution when using heavy equipment or power tools. Avoid working on ladders or at heights. Take showers instead of baths.  Do not swim alone.  Ensure the water temperature is not too high on the home water heater. Do not go swimming alone. Do not [place] yourself in a room alone (i.e. bathroom). When caring for infants or small children, sit down when holding, feeding, or changing them to minimize risk of injury to the child in the event you have a seizure. Maintain good sleep hygiene. Avoid alcohol.  Also recommend adequate sleep, hydration, good diet and minimize stress.   During the Seizure  - First, ensure adequate ventilation and place patients on the floor on their left side  Loosen clothing around the neck and ensure the airway is patent. If the patient is clenching the teeth, do not force the mouth open with any object as this can cause severe damage - Remove all items from the surrounding that can be hazardous. The patient may be oblivious to what's happening and may not even know what he or she is doing. If the patient is confused and wandering, either gently guide him/her away and block access to outside areas - Reassure the individual and be comforting - Call 911. In most cases, the seizure ends before EMS arrives. However, there are cases when seizures may last over 3 to 5 minutes. Or the individual may have developed breathing difficulties or severe injuries. If a pregnant patient or a person with diabetes develops a  seizure, it is prudent to call an ambulance. - Finally, if the patient does not regain full consciousness, then call EMS. Most patients will remain confused for about 45  to 90 minutes after a seizure, so you must use judgment in calling for help. - Avoid restraints but make sure the patient is in a bed with padded side rails - Place the individual in a lateral position with the neck slightly flexed; this will help the saliva drain from the mouth and prevent the tongue from falling backward - Remove all nearby furniture and other hazards from the area - Provide verbal assurance as the individual is regaining consciousness - Provide the patient with privacy if possible - Call for help and start treatment as ordered by the caregiver   After the Seizure (Postictal Stage)  After a seizure, most patients experience confusion, fatigue, muscle pain and/or a headache. Thus, one should permit the individual to sleep. For the next few days, reassurance is essential. Being calm and helping reorient the person is also of importance.  Most seizures are painless and end spontaneously. Seizures are not harmful to others but can lead to complications such as stress on the lungs, brain and the heart. Individuals with prior lung problems may develop labored breathing and respiratory distress.     Orders Placed This Encounter  Procedures   Levetiracetam level    Meds ordered this encounter  Medications   levETIRAcetam (KEPPRA) 500 MG tablet    Sig: Take 1 tablet (500 mg total) by mouth 2 (two) times daily.    Dispense:  60 tablet    Refill:  11    Return in about 6 months (around 03/17/2023).    Windell Norfolk, MD 09/15/2022, 11:13 AM  [facility] [address], [address] [place], [place] [address] [phone]

## 2022-09-15 NOTE — Patient Instructions (Addendum)
Continue with Keppra 500 mg twice daily  Will check a Keppra level today  Routine EEG  Discussed driving restriction for the next 6 months  Return in 6 months or sooner if worse

## 2022-09-17 LAB — LEVETIRACETAM LEVEL: Levetiracetam Lvl: <2 ug/mL — ABNORMAL LOW (ref 10.0–40.0)

## 2022-09-24 ENCOUNTER — Ambulatory Visit: Payer: Federal, State, Local not specified - PPO | Admitting: Neurology

## 2022-09-27 ENCOUNTER — Ambulatory Visit (INDEPENDENT_AMBULATORY_CARE_PROVIDER_SITE_OTHER): Payer: Federal, State, Local not specified - PPO | Admitting: Neurology

## 2022-09-27 ENCOUNTER — Encounter: Payer: Self-pay | Admitting: Neurology

## 2022-09-27 DIAGNOSIS — G40909 Epilepsy, unspecified, not intractable, without status epilepticus: Secondary | ICD-10-CM | POA: Diagnosis not present

## 2022-09-27 NOTE — Procedures (Signed)
    History:  21 year old man with seizure   EEG classification:  Awake and asleep  Description of the recording: The background rhythms of this recording consists of a fairly well modulated medium amplitude background activity of 10 Hz. As the record progresses, the patient initially is in the waking state, but appears to enter the early stage II sleep during the recording, with rudimentary sleep spindles and vertex sharp wave activity seen. During the wakeful state, photic stimulation is performed, and no abnormal responses were seen. Hyperventilation was also performed, no abnormal response seen. No epileptiform discharges seen during this recording. There was no focal slowing.  Abnormality: None   Impression: This is a normal EEG recording in the waking and sleeping state. No evidence interictal epileptiform discharges were seen at any time during the recording.  A normal EEG does not exclude a diagnosis of epilepsy.    Alric Ran, MD [facility]

## 2022-10-05 NOTE — Telephone Encounter (Signed)
FMLA PW completed and signed by Dr. April Manson.  Provided back to medical records for processing.

## 2022-10-06 NOTE — Telephone Encounter (Signed)
Attending Physician's Statement completed and placed on MD's desk for review/signature.

## 2022-10-07 ENCOUNTER — Emergency Department (HOSPITAL_COMMUNITY)
Admission: EM | Admit: 2022-10-07 | Discharge: 2022-10-07 | Disposition: A | Discharge status: Home / Self Care | Payer: Federal, State, Local not specified - PPO | Attending: Emergency Medicine | Admitting: Emergency Medicine

## 2022-10-07 ENCOUNTER — Encounter (HOSPITAL_COMMUNITY): Payer: Self-pay | Admitting: Emergency Medicine

## 2022-10-07 ENCOUNTER — Other Ambulatory Visit: Payer: Self-pay

## 2022-10-07 ENCOUNTER — Telehealth: Payer: Self-pay | Admitting: Neurology

## 2022-10-07 DIAGNOSIS — R42 Dizziness and giddiness: Secondary | ICD-10-CM | POA: Insufficient documentation

## 2022-10-07 DIAGNOSIS — R1084 Generalized abdominal pain: Secondary | ICD-10-CM | POA: Insufficient documentation

## 2022-10-07 DIAGNOSIS — R110 Nausea: Secondary | ICD-10-CM

## 2022-10-07 DIAGNOSIS — R531 Weakness: Secondary | ICD-10-CM | POA: Insufficient documentation

## 2022-10-07 DIAGNOSIS — R112 Nausea with vomiting, unspecified: Secondary | ICD-10-CM | POA: Insufficient documentation

## 2022-10-07 LAB — URINALYSIS, ROUTINE W REFLEX MICROSCOPIC
Bacteria, UA: NONE SEEN
Bilirubin Urine: NEGATIVE
Glucose, UA: NEGATIVE mg/dL
Hgb urine dipstick: NEGATIVE
Ketones, ur: NEGATIVE mg/dL
Leukocytes,Ua: NEGATIVE
Nitrite: NEGATIVE
Protein, ur: NEGATIVE mg/dL
Specific Gravity, Urine: 1.006 (ref 1.005–1.030)
pH: 6 (ref 5.0–8.0)

## 2022-10-07 LAB — CBC
HCT: 41.3 % (ref 36.0–46.0)
Hemoglobin: 13.4 g/dL (ref 12.0–15.0)
MCH: 30.5 pg (ref 26.0–34.0)
MCHC: 32.4 g/dL (ref 30.0–36.0)
MCV: 94.1 fL (ref 80.0–100.0)
Platelets: 323 10*3/uL (ref 150–400)
RBC: 4.39 MIL/uL (ref 3.87–5.11)
RDW: 11.9 % (ref 11.5–15.5)
WBC: 10.2 10*3/uL (ref 4.0–10.5)
nRBC: 0 % (ref 0.0–0.2)

## 2022-10-07 LAB — COMPREHENSIVE METABOLIC PANEL
ALT: 16 U/L (ref 0–44)
AST: 20 U/L (ref 15–41)
Albumin: 4 g/dL (ref 3.5–5.0)
Alkaline Phosphatase: 60 U/L (ref 38–126)
Anion gap: 6 (ref 5–15)
BUN: 11 mg/dL (ref 6–20)
CO2: 29 mmol/L (ref 22–32)
Calcium: 9.2 mg/dL (ref 8.9–10.3)
Chloride: 103 mmol/L (ref 98–111)
Creatinine, Ser: 0.8 mg/dL (ref 0.44–1.00)
GFR, Estimated: >60 mL/min (ref >60)
Glucose, Bld: 99 mg/dL (ref 70–99)
Potassium: 4.2 mmol/L (ref 3.5–5.1)
Sodium: 138 mmol/L (ref 135–145)
Total Bilirubin: 0.4 mg/dL (ref 0.3–1.2)
Total Protein: 7.7 g/dL (ref 6.5–8.1)

## 2022-10-07 LAB — I-STAT BETA HCG BLOOD, ED (MC, WL, AP ONLY): I-stat hCG, quantitative: <5 m[IU]/mL (ref <5)

## 2022-10-07 LAB — LIPASE, BLOOD: Lipase: 35 U/L (ref 11–51)

## 2022-10-07 NOTE — ED Provider Triage Note (Signed)
Emergency Medicine Provider Triage Evaluation Note  Sara Ford , a 21 y.o. female  was evaluated in triage.  Pt complains of 3 weeks worth of dizziness, nausea, vomiting and feeling weak.  She says that she took a pregnancy test and it was negative but that she feels the same way she did when she was previously pregnant with a negative pregnancy test.  Requesting a blood test.  Also says that if its not pregnancy she wants to know what is going on.  Last menstrual period 2 months ago but she says they are normally irregular due to PCOS  Review of Systems  Positive:  Negative:   Physical Exam  There were no vitals taken for this visit. Gen:   Awake, no distress   Resp:  Normal effort  MSK:   Moves extremities without difficulty  Other:  No focal tenderness of the abdomen.  Well-appearing  Medical Decision Making  Medically screening exam initiated at 3:46 PM.  Appropriate orders placed.  [name] was informed that the remainder of the evaluation will be completed by another provider, this initial triage assessment does not replace that evaluation, and the importance of remaining in the ED until their evaluation is complete.     Saddie Benders, PA-C 10/07/22 1551

## 2022-10-07 NOTE — ED Provider Notes (Signed)
[facility]-EMERGENCY DEPT Provider Note   CSN: [id number] Arrival date & time: 10/07/22  1502     History  Chief Complaint  Patient presents with   Nausea    Sara Ford is a 21 y.o. female with a past medical history of seizure disorder presenting today due to nausea, dizziness, vomiting and weakness.  Last menstrual period 2 months ago but she says this may be because she has PCOS which causes irregular menses.  She reports that the last time she felt this way she was pregnant despite having negative pregnancy test.  She would like a blood pregnancy test.  HPI     Home Medications Prior to Admission medications   Medication Sig Start Date End Date Taking? Authorizing Provider  levETIRAcetam (KEPPRA) 500 MG tablet Take 1 tablet (500 mg total) by mouth 2 (two) times daily. 09/15/22   Windell Norfolk, MD      Allergies    Contrast media [iodinated contrast media] and Penicillins    Review of Systems   Review of Systems  Physical Exam Updated Vital Signs BP 132/80   Pulse 86   Temp 98 F (36.7 C) (Oral)   Resp 16   SpO2 97%  Physical Exam Vitals and nursing note reviewed.  Constitutional:      Appearance: Normal appearance.  HENT:     Head: Normocephalic and atraumatic.  Eyes:     General: No scleral icterus.    Conjunctiva/sclera: Conjunctivae normal.  Pulmonary:     Effort: Pulmonary effort is normal. No respiratory distress.  Abdominal:     General: Abdomen is flat.     Palpations: Abdomen is soft.     Tenderness: There is abdominal tenderness.  Skin:    General: Skin is warm and dry.     Findings: No rash.  Neurological:     Mental Status: She is alert.  Psychiatric:        Mood and Affect: Mood normal.     ED Results / Procedures / Treatments   Labs (all labs ordered are listed, but only abnormal results are displayed) Labs Reviewed  URINALYSIS, ROUTINE W REFLEX MICROSCOPIC - Abnormal; Notable for the following  components:      Result Value   Color, Urine STRAW (*)    All other components within normal limits  LIPASE, BLOOD  COMPREHENSIVE METABOLIC PANEL  CBC  I-STAT BETA HCG BLOOD, ED ([facility], [facility], [facility] ONLY)    EKG None  Radiology No results found.  Procedures Procedures   Medications Ordered in ED Medications - No data to display  ED Course/ Medical Decision Making/ A&P                           Medical Decision Making Amount and/or Complexity of Data Reviewed Labs: ordered.   21 year old female presenting with multiple complaints.  Concerned she may be pregnant.  Also complaining of abdominal pain and nausea.  Differential includes but is not limited to cholecystitis, appendicitis, pregnancy, ovarian cyst, ovarian torsion, hormone imbalance.    This is not an exhaustive differential.    Past Medical History / Co-morbidities / Social History: User disorder   Additional history: Per chart review patient has never seen gastroenterology   Physical Exam: Pertinent physical exam findings include Denies abdominal tenderness  Lab Tests: Normal lab work and UA   Imaging Studies: Considered however patient's tenderness was generalized.     Medications: Patient was offered  pain and nausea medications but she declined them.  Says that she already has stuff at home.  She was offered multiple times and continued to decline treatment  MDM/Disposition: This is a 21 year old female who presented today with a concern for pregnancy.  Complained of weakness, nausea, vomiting, abdominal pain and other vague symptoms that have been ongoing for multiple weeks to months.  Lab work negative.  Considered imaging however patient does not have any focal tenderness on exam.  Vital signs stable.  I told the patient that her work-up was negative and she told me that she had multiple concerns because multiple of her labs were on the "border of normal."  We discussed that normal is normal and for any  further concerns she should see GI.  She tells me that she has seen the multiple times and had an endoscopy and colonoscopy.  There is no record of this.  She declined any treatment or prescription medications and requested to be discharged.  She is clearly frustrated however there is no more intervention that is indicated from the emergency department.  Ambulated out of the department  Final Clinical Impression(s) / ED Diagnoses Final diagnoses:  Generalized abdominal pain  Nausea    Rx / DC Orders ED Discharge Orders     None      Results and diagnoses were explained to the patient. Return precautions discussed in full. Patient had no additional questions and expressed complete understanding.   This chart was dictated using voice recognition software.  Despite best efforts to proofread,  errors can occur which can change the documentation meaning.    Woodroe Chen 10/07/22 2029    Lorre Nick, MD 10/11/22 1428

## 2022-10-07 NOTE — Discharge Instructions (Addendum)
As we discussed, all of your lab work is normal.  You did not want any further medications today.  Please follow-up with a gastroenterologist or primary care doctor.  Return with any worsening symptoms.

## 2022-10-07 NOTE — Telephone Encounter (Signed)
Error

## 2022-10-07 NOTE — Telephone Encounter (Signed)
Attending Physician's Statement faxed to [facility] (fax# [phone]) and pt called to p/u copy

## 2022-10-07 NOTE — ED Triage Notes (Signed)
Pt reports pregnancy symptoms but reports he test was negative. Wants blood test to confirm

## 2022-12-21 ENCOUNTER — Inpatient Hospital Stay (HOSPITAL_COMMUNITY)
Admission: AD | Admit: 2022-12-21 | Discharge: 2022-12-21 | Disposition: A | Discharge status: Home / Self Care | Payer: Federal, State, Local not specified - PPO | Attending: Obstetrics and Gynecology | Admitting: Obstetrics and Gynecology

## 2022-12-21 ENCOUNTER — Encounter (HOSPITAL_COMMUNITY): Payer: Self-pay | Admitting: *Deleted

## 2022-12-21 DIAGNOSIS — K529 Noninfective gastroenteritis and colitis, unspecified: Secondary | ICD-10-CM

## 2022-12-21 DIAGNOSIS — M545 Low back pain, unspecified: Secondary | ICD-10-CM | POA: Diagnosis not present

## 2022-12-21 DIAGNOSIS — E282 Polycystic ovarian syndrome: Secondary | ICD-10-CM | POA: Diagnosis not present

## 2022-12-21 DIAGNOSIS — Z3202 Encounter for pregnancy test, result negative: Secondary | ICD-10-CM | POA: Diagnosis not present

## 2022-12-21 DIAGNOSIS — N912 Amenorrhea, unspecified: Secondary | ICD-10-CM | POA: Diagnosis not present

## 2022-12-21 DIAGNOSIS — N926 Irregular menstruation, unspecified: Secondary | ICD-10-CM | POA: Insufficient documentation

## 2022-12-21 DIAGNOSIS — R1030 Lower abdominal pain, unspecified: Secondary | ICD-10-CM | POA: Insufficient documentation

## 2022-12-21 DIAGNOSIS — R1084 Generalized abdominal pain: Secondary | ICD-10-CM

## 2022-12-21 DIAGNOSIS — R112 Nausea with vomiting, unspecified: Secondary | ICD-10-CM

## 2022-12-21 LAB — CBC
HCT: 41.8 % (ref 36.0–46.0)
Hemoglobin: 14.2 g/dL (ref 12.0–15.0)
MCH: 31.6 pg (ref 26.0–34.0)
MCHC: 34 g/dL (ref 30.0–36.0)
MCV: 92.9 fL (ref 80.0–100.0)
Platelets: 333 10*3/uL (ref 150–400)
RBC: 4.5 MIL/uL (ref 3.87–5.11)
RDW: 11.8 % (ref 11.5–15.5)
WBC: 10.7 10*3/uL — ABNORMAL HIGH (ref 4.0–10.5)
nRBC: 0 % (ref 0.0–0.2)

## 2022-12-21 LAB — URINALYSIS, ROUTINE W REFLEX MICROSCOPIC
Bilirubin Urine: NEGATIVE
Glucose, UA: NEGATIVE mg/dL
Hgb urine dipstick: NEGATIVE
Ketones, ur: NEGATIVE mg/dL
Leukocytes,Ua: NEGATIVE
Nitrite: NEGATIVE
Protein, ur: NEGATIVE mg/dL
Specific Gravity, Urine: 1.02 (ref 1.005–1.030)
pH: 7 (ref 5.0–8.0)

## 2022-12-21 LAB — WET PREP, GENITAL
Sperm: NONE SEEN
Trich, Wet Prep: NONE SEEN
WBC, Wet Prep HPF POC: <10 (ref <10)
Yeast Wet Prep HPF POC: NONE SEEN

## 2022-12-21 LAB — ABO/RH: ABO/RH(D): B POS

## 2022-12-21 LAB — HCG, QUANTITATIVE, PREGNANCY: hCG, Beta Chain, Quant, S: <1 m[IU]/mL (ref <5)

## 2022-12-21 LAB — POCT PREGNANCY, URINE: Preg Test, Ur: NEGATIVE

## 2022-12-21 MED ORDER — HYOSCYAMINE SULFATE 0.125 MG SL SUBL: 0.1250 mg | SUBLINGUAL_TABLET | Freq: Once | SUBLINGUAL | Status: DC

## 2022-12-21 MED ORDER — SIMETHICONE 80 MG PO CHEW: 80.0000 mg | CHEWABLE_TABLET | Freq: Four times a day (QID) | ORAL | 0 refills | Status: AC | PRN

## 2022-12-21 MED ORDER — SIMETHICONE 80 MG PO CHEW: 80.0000 mg | CHEWABLE_TABLET | Freq: Once | ORAL | Status: DC

## 2022-12-21 MED ORDER — ONDANSETRON 4 MG PO TBDP: 4.0000 mg | ORAL_TABLET | Freq: Four times a day (QID) | ORAL | 0 refills | Status: AC | PRN

## 2022-12-21 NOTE — MAU Provider Note (Signed)
Chief Complaint: Possible Pregnancy   Event Date/Time   First Provider Initiated Contact with Patient 12/21/22 2134        SUBJECTIVE HPI: Sara Ford is a 22 y.o. No obstetric history on file. at Unknown by LMP who presents to maternity admissions reporting irregular menses, positive pregnancy test at home (?faint), low back pain, lower abdominal cramping and nausea.  .States has had bowel movements every 30 minutes for two weeks straight. She denies vaginal bleeding, vaginal itching/burning, urinary symptoms, h/a, dizziness, n/v, or fever/chills.    States has an OB/GYN at [facility] but has not told them about these symptoms.  States doesn't know why.  States has had irregular menses all of her life, was told she has PCOS.  Was treated with Metformin and OCPs but stopped them on her own.    Possible Pregnancy This is a new problem. The current episode started in the past 7 days. The problem has been unchanged. Associated symptoms include abdominal pain and nausea. Pertinent negatives include no chills, fever, headaches, visual change or vomiting. Nothing aggravates the symptoms. She has tried nothing for the symptoms.  Abdominal Pain This is a new problem. The current episode started in the past 7 days. The onset quality is gradual. The problem occurs intermittently. The problem has been gradually improving since onset. The pain is located in the generalized abdominal region. The quality of the pain is described as cramping. Associated symptoms include nausea. Pertinent negatives include no constipation, diarrhea, dysuria, fever, frequency, headaches or vomiting. Nothing relieves the symptoms. Past treatments include nothing.   RN Note: Sara Ford is a 22 y.o. at Unknown here in MAU reporting period in Dec was not normal and no period in Jan. Has taken 2 upts and one faintly positive and one negative. Achy pain in lower back and mild cramping in lower abd. Nausea. Currently  has upper and lower abdominal pain and lower back pain. States feels gasy LMP: 11/17/22 Onset of complaint: 4 days ago          Pain score: 4  Past Medical History:  Diagnosis Date   Psoriasis    No past surgical history on file. Social History   Socioeconomic History   Marital status: Single    Spouse name: Not on file   Number of children: Not on file   Years of education: Not on file   Highest education level: Not on file  Occupational History   Not on file  Tobacco Use   Smoking status: Never   Smokeless tobacco: Never  Substance and Sexual Activity   Alcohol use: Not Currently   Drug use: Not Currently   Sexual activity: Not on file  Other Topics Concern   Not on file  Social History Narrative   Not on file   Social Determinants of Health   Financial Resource Strain: Not on file  Food Insecurity: Not on file  Transportation Needs: Not on file  Physical Activity: Not on file  Stress: Not on file  Social Connections: Not on file  Intimate Partner Violence: Not on file   No current facility-administered medications on file prior to encounter.   Current Outpatient Medications on File Prior to Encounter  Medication Sig Dispense Refill   levETIRAcetam (KEPPRA) 500 MG tablet Take 1 tablet (500 mg total) by mouth 2 (two) times daily. 60 tablet 11   Allergies  Allergen Reactions   Contrast Media [Iodinated Contrast Media] Anaphylaxis   Penicillins  I have reviewed patient's Past Medical Hx, Surgical Hx, Family Hx, Social Hx, medications and allergies.   ROS:  Review of Systems  Constitutional:  Negative for chills and fever.  Gastrointestinal:  Positive for abdominal pain and nausea. Negative for constipation, diarrhea and vomiting.  Genitourinary:  Negative for dysuria and frequency.  Neurological:  Negative for headaches.   Review of Systems  Other systems negative   Physical Exam  Physical Exam Patient Vitals for the past 24 hrs:  BP Pulse Resp  SpO2 Height Weight  12/21/22 1957 111/75 -- -- -- -- --  12/21/22 1954 -- 84 17 99 % 5\' 1"  (1.549 m) 83.5 kg   Constitutional: Well-developed, well-nourished female in no acute distress.  Cardiovascular: normal rate Respiratory: normal effort GI: Abd soft, non-tender.  MS: Extremities nontender, no edema, normal ROM Neurologic: Alert and oriented x 4.  GU: Neg CVAT.  PELVIC EXAM: Deferred  LAB RESULTS Results for orders placed or performed during the hospital encounter of 12/21/22 (from the past 24 hour(s))  Urinalysis, Routine w reflex microscopic Urine, Clean Catch     Status: Abnormal   Collection Time: 12/21/22  8:05 PM  Result Value Ref Range   Color, Urine YELLOW YELLOW   APPearance HAZY (A) CLEAR   Specific Gravity, Urine 1.020 1.005 - 1.030   pH 7.0 5.0 - 8.0   Glucose, UA NEGATIVE NEGATIVE mg/dL   Hgb urine dipstick NEGATIVE NEGATIVE   Bilirubin Urine NEGATIVE NEGATIVE   Ketones, ur NEGATIVE NEGATIVE mg/dL   Protein, ur NEGATIVE NEGATIVE mg/dL   Nitrite NEGATIVE NEGATIVE   Leukocytes,Ua NEGATIVE NEGATIVE  Pregnancy, urine POC     Status: None   Collection Time: 12/21/22  8:10 PM  Result Value Ref Range   Preg Test, Ur NEGATIVE NEGATIVE  ABO/Rh     Status: None   Collection Time: 12/21/22  9:06 PM  Result Value Ref Range   ABO/RH(D) B POS    No rh immune globuloin      NOT A RH IMMUNE GLOBULIN CANDIDATE, PT RH POSITIVE Performed at [facility], [address], [place], [place]   hCG, quantitative, pregnancy     Status: None   Collection Time: 12/21/22  9:08 PM  Result Value Ref Range   hCG, Beta Chain, Quant, S <1 <5 mIU/mL  CBC     Status: Abnormal   Collection Time: 12/21/22  9:08 PM  Result Value Ref Range   WBC 10.7 (H) 4.0 - 10.5 K/uL   RBC 4.50 3.87 - 5.11 MIL/uL   Hemoglobin 14.2 12.0 - 15.0 g/dL   HCT 12/23/22 32.4 - 40.1 %   MCV 92.9 80.0 - 100.0 fL   MCH 31.6 26.0 - 34.0 pg   MCHC 34.0 30.0 - 36.0 g/dL   RDW 02.7 25.3 - 66.4 %    Platelets 333 150 - 400 K/uL   nRBC 0.0 0.0 - 0.2 %  Wet prep, genital     Status: Abnormal   Collection Time: 12/21/22  9:35 PM   Specimen: PATH Cytology Cervicovaginal Ancillary Only  Result Value Ref Range   Yeast Wet Prep HPF POC NONE SEEN NONE SEEN   Trich, Wet Prep NONE SEEN NONE SEEN   Clue Cells Wet Prep HPF POC PRESENT (A) NONE SEEN   WBC, Wet Prep HPF POC <10 <10   Sperm NONE SEEN     --/--/PENDING (01/23 2106)  IMAGING No results found.  MAU Management/MDM: I have reviewed the  triage vital signs and the nursing notes.   Pertinent labs & imaging results that were available during my care of the patient were reviewed by me and considered in my medical decision making (see chart for details).      I have reviewed her medical records including past results, notes and treatments. Medical, Surgical, and family history were reviewed.  Medications and recent lab tests were reviewed  Ordered Serum HCG level as well as other labs above Informed patient of normal labs and negative blood pregnancy test Patient appeared very happy to hear this.  When asked if she was concerned about her pain she states she was not.  Discussed this may warrant further workup with primary doctor or Urgent care.  No acute findings to warrant more in depth evaluation here in MAU given negative pregnancy test and no complaints of pain currently . Has not been noted to be having q73min stools while here in MAU (stated has had them this way for 2 weeks with vomiting today)   No vomiting while here  Offered meds for cramping and nausea, she declines.   ASSESSMENT Equivocal pregnancy test at home, negative serum test here Lower abdominal pain Low back pain Frequent stools Amenorrhea Irregular menses vomiting  PLAN Discharge home Followup with OB/GYN doctor  Pt stable at time of discharge. Encouraged to return here or ED if she develops worsening of symptoms, increase in pain, fever, or other  concerning symptoms.    Hansel Feinstein CNM, MSN Certified Nurse-Midwife 12/21/2022  9:34 PM

## 2022-12-21 NOTE — Progress Notes (Signed)
Hansel Feinstein CNM in Family Rm to see pt and discuss test results and d/c plan. Written and verbal d/c instructions given by CNM and understanding voiced

## 2022-12-21 NOTE — MAU Note (Signed)
.  Sara Ford is a 22 y.o. at Unknown here in MAU reporting period in Dec was not normal and no period in Jan. Has taken 2 upts and one faintly positive and one negative. Achy pain in lower back and mild cramping in lower abd. Nausea. Currently has upper and lower abdominal pain and lower back pain. States feels gasy LMP: 11/17/22 Onset of complaint: 4 days ago Pain score: 4 Vitals:   12/21/22 1954 12/21/22 1957  BP:  111/75  Pulse: 84   Resp: 17   SpO2: 99%      FHT:n/a Lab orders placed from triage:  upt

## 2022-12-22 LAB — GC/CHLAMYDIA PROBE AMP (~~LOC~~) NOT AT ARMC
Chlamydia: NEGATIVE
Comment: NEGATIVE
Comment: NORMAL
Neisseria Gonorrhea: NEGATIVE

## 2022-12-31 IMAGING — CR DG TOE 4TH 2+V*R*
3 series · 3 of 3 positions shown · non-contrast
Comparison: None.

CLINICAL DATA: Pain in the right fourth toe with no known injury.

EXAM:
RIGHT FOURTH TOE

[toe ap]
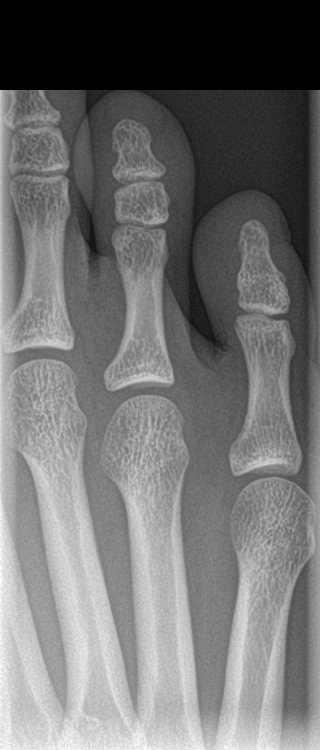

[toe obl]
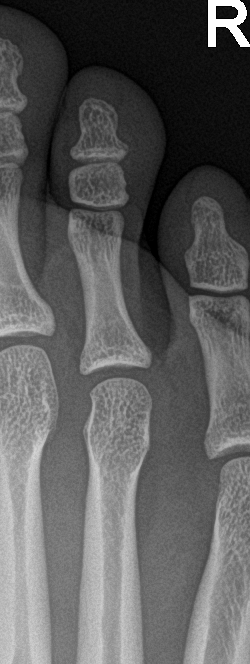

[toe lat]
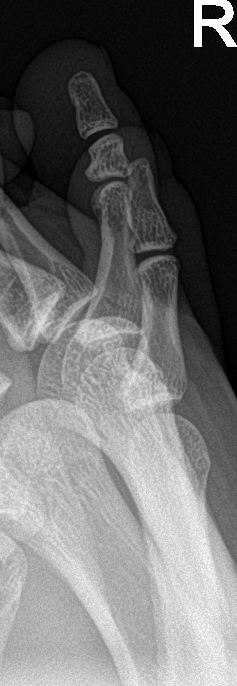

[3 of 3 positions shown; findings below may reference images not displayed]

FINDINGS: There is no evidence of fracture or dislocation. There is no
evidence of arthropathy or other focal bone abnormality. Soft
tissues are unremarkable.
IMPRESSION: Negative.

## 2022-12-31 IMAGING — CT CT ABD-PELV W/O CM
2 of 4 series · 17 of 46 positions shown, 19 images · non-contrast
Comparison: None.

CLINICAL DATA: Abdominal pain, acute, nonlocalized abdominal pain.
Nausea, vomiting.

EXAM:
CT ABDOMEN AND PELVIS WITHOUT CONTRAST
TECHNIQUE: Multidetector CT imaging of the abdomen and pelvis was performed
following the standard protocol without IV contrast.

[Series 3: ap without · axial · non-contrast · 0.87mm/px · z∈[-917,-522]mm · 14 of 89 slices shown, 16 images]
[im 5/89  soft-tissue]
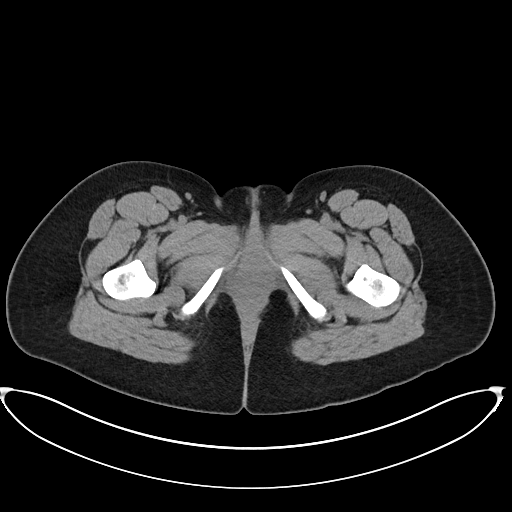
[im 5/89  bone]
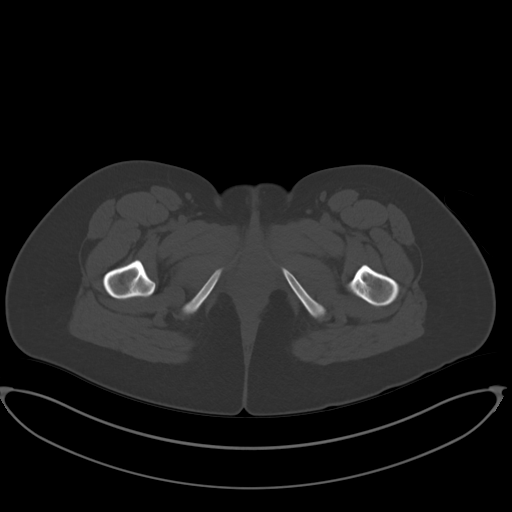
[im 10/89  soft-tissue]
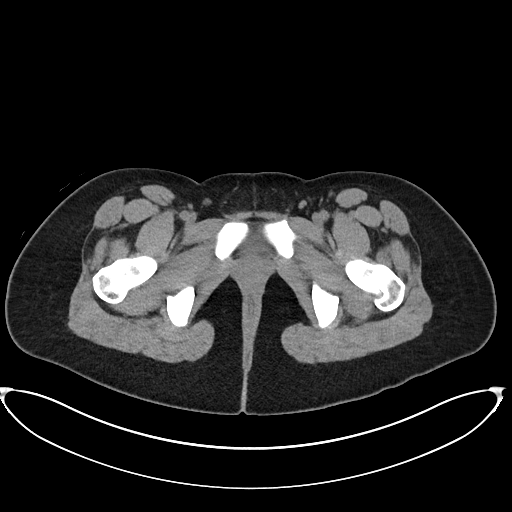
[im 19/89  soft-tissue]
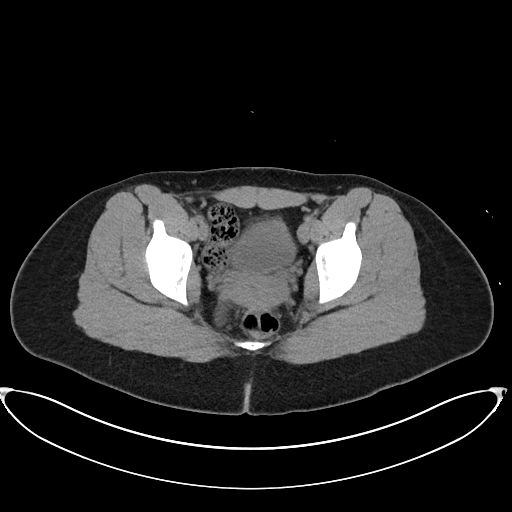
[im 24/89  soft-tissue]
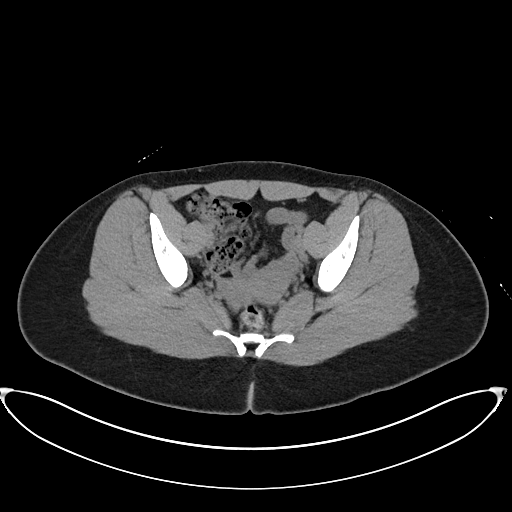
[im 28/89  soft-tissue]
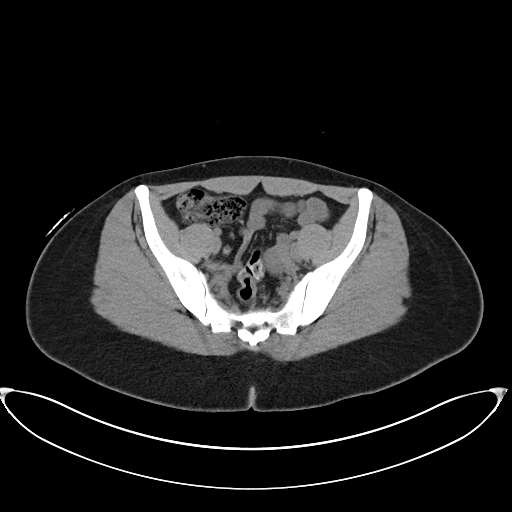
[im 38/89  soft-tissue]
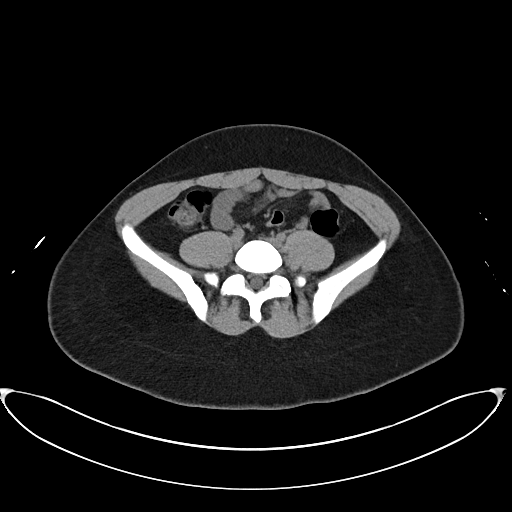
[im 42/89  soft-tissue]
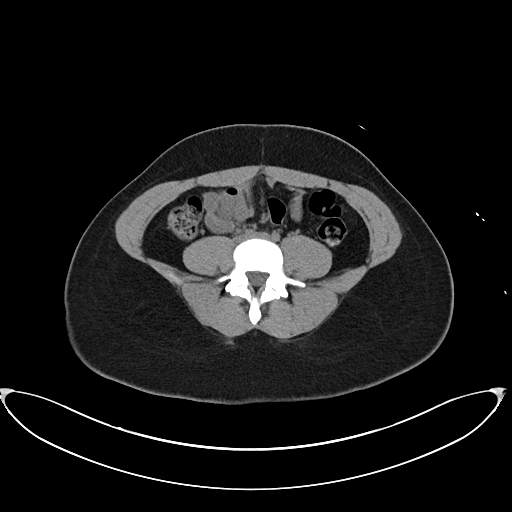
[im 47/89  soft-tissue]
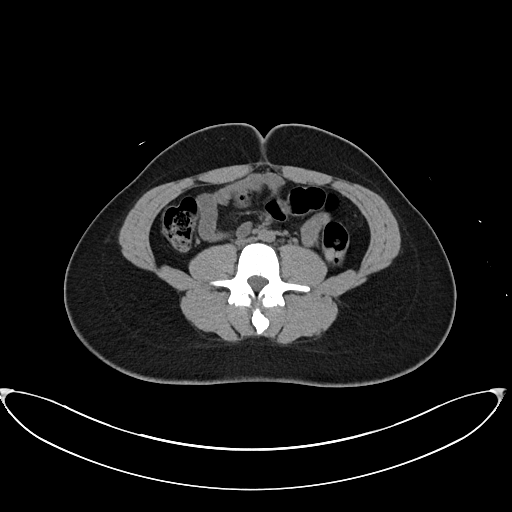
[im 51/89  soft-tissue]
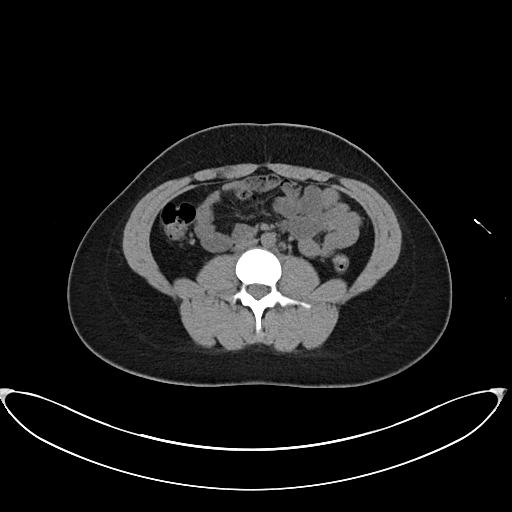
[im 51/89  bone]
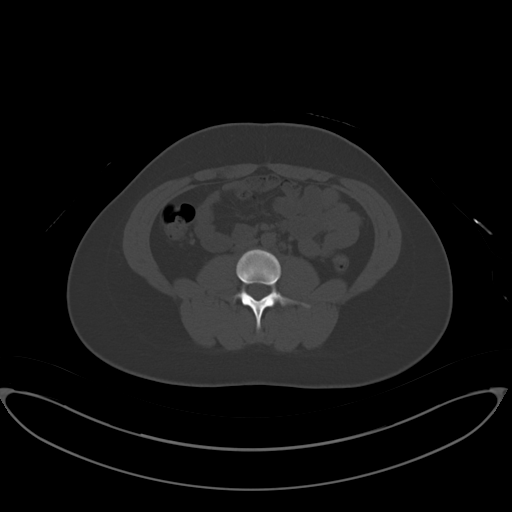
[im 61/89  soft-tissue]
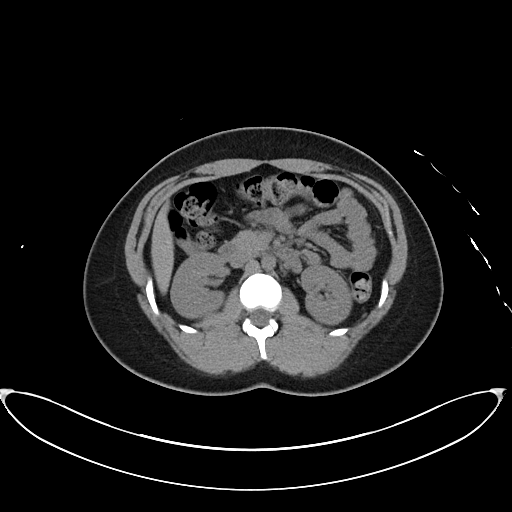
[im 65/89  soft-tissue]
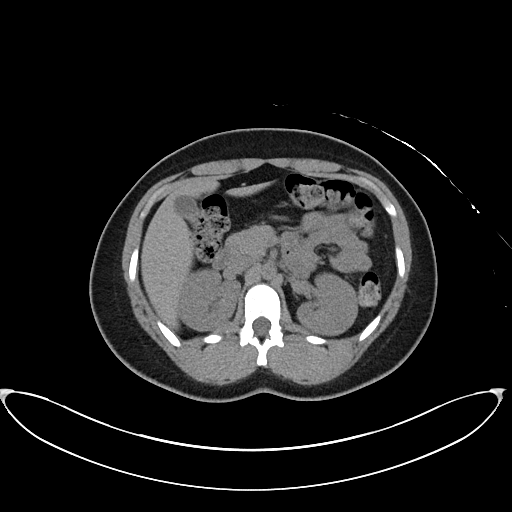
[im 70/89  soft-tissue]
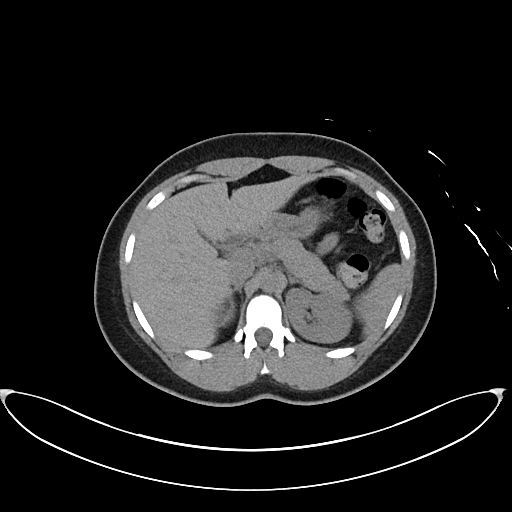
[im 79/89  soft-tissue]
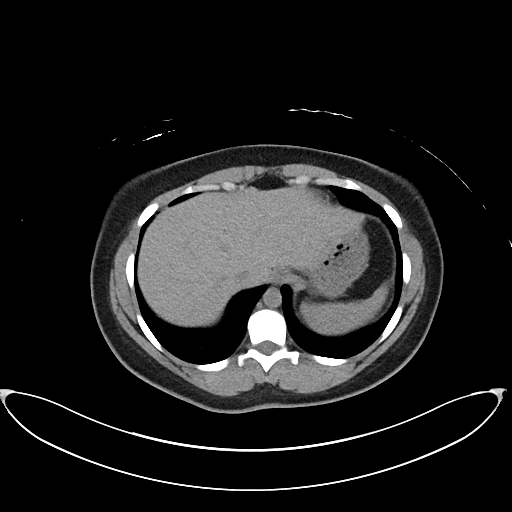
[im 84/89  soft-tissue]
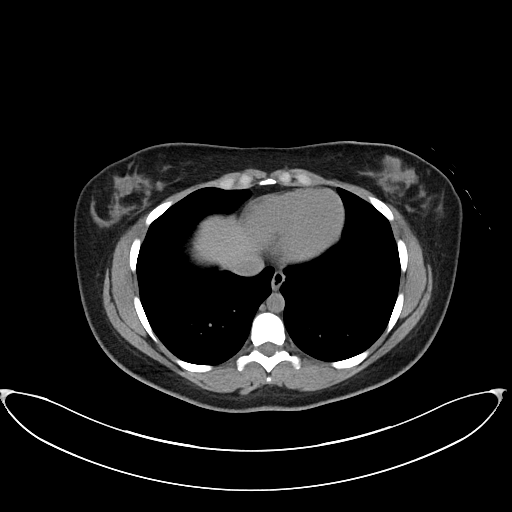

[Series 6: cor · coronal · 0.88mm/px · 3 of 84 slices shown]
[im 28/84  soft-tissue]
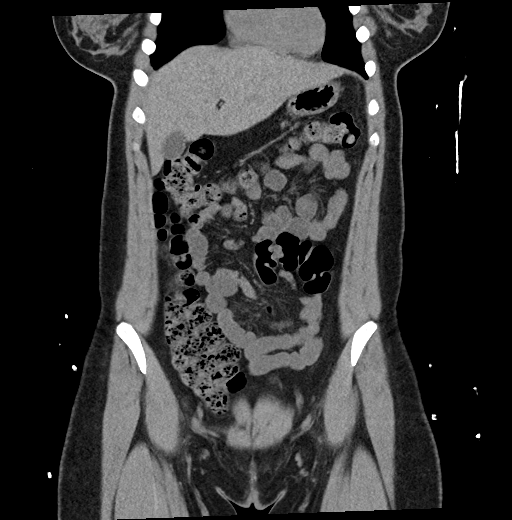
[im 37/84  soft-tissue]
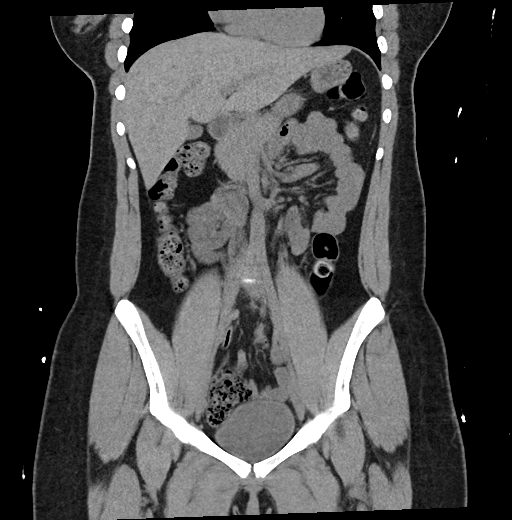
[im 47/84  soft-tissue]
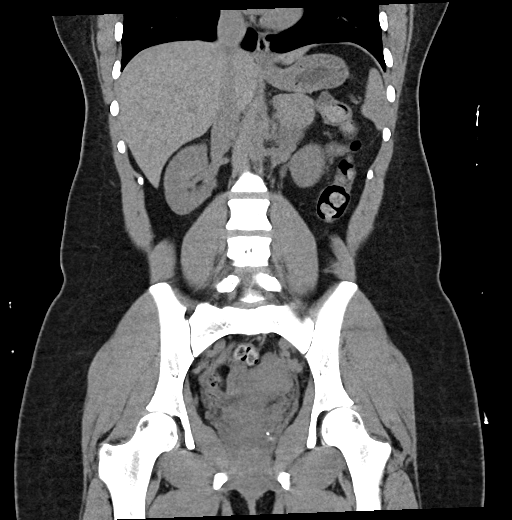

[17 of 46 positions shown; findings below may reference images not displayed]

FINDINGS: Lower chest: Lung bases are clear. No effusions. Heart is normal
size.

Hepatobiliary: No focal hepatic abnormality. Gallbladder
unremarkable.

Pancreas: No focal abnormality or ductal dilatation.

Spleen: No focal abnormality.  Normal size.

Adrenals/Urinary Tract: No adrenal abnormality. No focal renal
abnormality. No stones or hydronephrosis. Urinary bladder is
unremarkable.

Stomach/Bowel: Normal appendix. Stomach, large and small bowel
grossly unremarkable.

Vascular/Lymphatic: No evidence of aneurysm or adenopathy.

Reproductive: Uterus and adnexa unremarkable.  No mass.

Other: No free fluid or free air.

Musculoskeletal: No acute bony abnormality.
IMPRESSION: Normal appendix.

No acute findings in the abdomen or pelvis.

## 2023-01-31 ENCOUNTER — Encounter (HOSPITAL_BASED_OUTPATIENT_CLINIC_OR_DEPARTMENT_OTHER): Payer: Self-pay | Admitting: Emergency Medicine

## 2023-01-31 ENCOUNTER — Emergency Department (HOSPITAL_BASED_OUTPATIENT_CLINIC_OR_DEPARTMENT_OTHER)
Admission: EM | Admit: 2023-01-31 | Discharge: 2023-02-01 | Disposition: A | Discharge status: Home / Self Care | Payer: Federal, State, Local not specified - PPO | Attending: Emergency Medicine | Admitting: Emergency Medicine

## 2023-01-31 ENCOUNTER — Other Ambulatory Visit: Payer: Self-pay

## 2023-01-31 DIAGNOSIS — M549 Dorsalgia, unspecified: Secondary | ICD-10-CM | POA: Diagnosis not present

## 2023-01-31 DIAGNOSIS — U071 COVID-19: Secondary | ICD-10-CM | POA: Insufficient documentation

## 2023-01-31 DIAGNOSIS — M791 Myalgia, unspecified site: Secondary | ICD-10-CM

## 2023-01-31 DIAGNOSIS — R509 Fever, unspecified: Secondary | ICD-10-CM | POA: Diagnosis present

## 2023-01-31 LAB — COMPREHENSIVE METABOLIC PANEL
ALT: 17 U/L (ref 0–44)
AST: 21 U/L (ref 15–41)
Albumin: 4.7 g/dL (ref 3.5–5.0)
Alkaline Phosphatase: 39 U/L (ref 38–126)
Anion gap: 10 (ref 5–15)
BUN: 8 mg/dL (ref 6–20)
CO2: 22 mmol/L (ref 22–32)
Calcium: 10.1 mg/dL (ref 8.9–10.3)
Chloride: 103 mmol/L (ref 98–111)
Creatinine, Ser: 0.92 mg/dL (ref 0.44–1.00)
GFR, Estimated: >60 mL/min (ref >60)
Glucose, Bld: 79 mg/dL (ref 70–99)
Potassium: 3.6 mmol/L (ref 3.5–5.1)
Sodium: 135 mmol/L (ref 135–145)
Total Bilirubin: 0.4 mg/dL (ref 0.3–1.2)
Total Protein: 8.1 g/dL (ref 6.5–8.1)

## 2023-01-31 LAB — CBC WITH DIFFERENTIAL/PLATELET
Abs Immature Granulocytes: 0.08 10*3/uL — ABNORMAL HIGH (ref 0.00–0.07)
Basophils Absolute: 0 10*3/uL (ref 0.0–0.1)
Basophils Relative: 0 %
Eosinophils Absolute: 0.1 10*3/uL (ref 0.0–0.5)
Eosinophils Relative: 1 %
HCT: 41.7 % (ref 36.0–46.0)
Hemoglobin: 14.1 g/dL (ref 12.0–15.0)
Immature Granulocytes: 1 %
Lymphocytes Relative: 6 %
Lymphs Abs: 0.7 10*3/uL (ref 0.7–4.0)
MCH: 31.1 pg (ref 26.0–34.0)
MCHC: 33.8 g/dL (ref 30.0–36.0)
MCV: 91.9 fL (ref 80.0–100.0)
Monocytes Absolute: 1.4 10*3/uL — ABNORMAL HIGH (ref 0.1–1.0)
Monocytes Relative: 12 %
Neutro Abs: 9.3 10*3/uL — ABNORMAL HIGH (ref 1.7–7.7)
Neutrophils Relative %: 80 %
Platelets: 252 10*3/uL (ref 150–400)
RBC: 4.54 MIL/uL (ref 3.87–5.11)
RDW: 12 % (ref 11.5–15.5)
WBC: 11.5 10*3/uL — ABNORMAL HIGH (ref 4.0–10.5)
nRBC: 0 % (ref 0.0–0.2)

## 2023-01-31 LAB — URINALYSIS, ROUTINE W REFLEX MICROSCOPIC
Bilirubin Urine: NEGATIVE
Glucose, UA: NEGATIVE mg/dL
Hgb urine dipstick: NEGATIVE
Ketones, ur: NEGATIVE mg/dL
Leukocytes,Ua: NEGATIVE
Nitrite: NEGATIVE
Protein, ur: NEGATIVE mg/dL
Specific Gravity, Urine: 1.009 (ref 1.005–1.030)
pH: 8 (ref 5.0–8.0)

## 2023-01-31 LAB — RESP PANEL BY RT-PCR (RSV, FLU A&B, COVID)  RVPGX2
Influenza A by PCR: NEGATIVE
Influenza B by PCR: NEGATIVE
Resp Syncytial Virus by PCR: NEGATIVE
SARS Coronavirus 2 by RT PCR: POSITIVE — AB

## 2023-01-31 LAB — PREGNANCY, URINE: Preg Test, Ur: NEGATIVE

## 2023-01-31 MED ORDER — KETOROLAC TROMETHAMINE 30 MG/ML IJ SOLN
30.0000 mg | Freq: Once | INTRAMUSCULAR | Status: AC
  Administered 2023-02-01: 30 mg via INTRAVENOUS
  Filled 2023-01-31: qty 1

## 2023-01-31 MED ORDER — ACETAMINOPHEN 325 MG PO TABS
650.0000 mg | ORAL_TABLET | Freq: Once | ORAL | Status: AC | PRN
  Administered 2023-01-31: 650 mg via ORAL
  Filled 2023-01-31: qty 2

## 2023-01-31 NOTE — ED Triage Notes (Addendum)
Pt in from home via [facility] with excruciating pain to low back, states ongoing x 2 wks, but got worse tonight. Denies any known injury, does have a fever in triage  - 101F, denies any urinary symptoms. Pt endorses bilateral arm/hand numbness, and trouble walking. Tearful in triage, LMP 3 mo's ago.

## 2023-01-31 NOTE — ED Provider Notes (Signed)
[facility] Provider Note   CSN: [id number] Arrival date & time: 01/31/23  1926     History {Add pertinent medical, surgical, social history, OB history to HPI:1} Chief Complaint  Patient presents with   Back Pain   Fever    Sara Ford is a 22 y.o. female.  HPI     This is a 22 year old female who presents back pain.  Patient reports she has had a 2-week history of ongoing back pain.  However her pain worsened today.  She states that it is sharp and is across her lower back.  She has been taking Tylenol with minimal relief.  She was noted to have a fever in triage to 101.  Had not noted any fevers at home.  Denies any urinary symptoms.  Denies any injury.  Denies any bowel or bladder difficulty.  Reports tingling in her hands and feet.  Denies any weakness of the lower extremities.  She denies any immunosuppressive drugs or IV drug use.  Home Medications Prior to Admission medications   Medication Sig Start Date End Date Taking? Authorizing Provider  levETIRAcetam (KEPPRA) 500 MG tablet Take 1 tablet (500 mg total) by mouth 2 (two) times daily. 09/15/22   Alric Ran, MD  ondansetron (ZOFRAN-ODT) 4 MG disintegrating tablet Take 1 tablet (4 mg total) by mouth every 6 (six) hours as needed for nausea. 12/21/22   Seabron Spates, CNM  simethicone (MYLICON) 80 MG chewable tablet Chew 1 tablet (80 mg total) by mouth 4 (four) times daily as needed for flatulence. 12/21/22   Seabron Spates, CNM      Allergies    Contrast media [iodinated contrast media] and Penicillins    Review of Systems   Review of Systems  Constitutional:  Positive for fever.  Musculoskeletal:  Positive for back pain.  All other systems reviewed and are negative.   Physical Exam Updated Vital Signs BP 115/87   Pulse 94   Temp 100.1 F (37.8 C) (Oral)   Resp (!) 36   Wt 83 kg   LMP 11/02/2022 (Approximate)   SpO2 100%   BMI 34.58 kg/m  Physical  Exam Vitals and nursing note reviewed.  Constitutional:      Appearance: She is well-developed.     Comments: Anxious appearing, nontoxic  HENT:     Head: Normocephalic and atraumatic.     Mouth/Throat:     Mouth: Mucous membranes are moist.  Eyes:     Pupils: Pupils are equal, round, and reactive to light.  Cardiovascular:     Rate and Rhythm: Normal rate and regular rhythm.     Heart sounds: Normal heart sounds.  Pulmonary:     Effort: Pulmonary effort is normal. No respiratory distress.     Breath sounds: No wheezing.  Abdominal:     General: Bowel sounds are normal.     Palpations: Abdomen is soft.  Musculoskeletal:     Cervical back: Neck supple.  Skin:    General: Skin is warm and dry.     Comments: Exquisite tenderness to light touch over the entire back including thoracic and lumbar regions bilaterally, no overlying skin changes, she does have patchy areas of psoriasis over the knees and arms.  Neurological:     Mental Status: She is alert and oriented to person, place, and time.     Comments: 5 out of 5 strength bilateral lower extremities, normal and equal patellar reflexes bilaterally, no clonus  Psychiatric:     Comments: Anxious     ED Results / Procedures / Treatments   Labs (all labs ordered are listed, but only abnormal results are displayed) Labs Reviewed  CBC WITH DIFFERENTIAL/PLATELET - Abnormal; Notable for the following components:      Result Value   WBC 11.5 (*)    Neutro Abs 9.3 (*)    Monocytes Absolute 1.4 (*)    Abs Immature Granulocytes 0.08 (*)    All other components within normal limits  URINALYSIS, ROUTINE W REFLEX MICROSCOPIC - Abnormal; Notable for the following components:   Color, Urine COLORLESS (*)    All other components within normal limits  RESP PANEL BY RT-PCR (RSV, FLU A&B, COVID)  [id number]  COMPREHENSIVE METABOLIC PANEL  PREGNANCY, URINE    EKG None  Radiology No results found.  Procedures Procedures  {Document  cardiac monitor, telemetry assessment procedure when appropriate:1}  Medications Ordered in ED Medications  ketorolac (TORADOL) 30 MG/ML injection 30 mg (has no administration in time range)  acetaminophen (TYLENOL) tablet 650 mg (650 mg Oral Given 01/31/23 1955)    ED Course/ Medical Decision Making/ A&P   {   Click here for ABCD2, HEART and other calculatorsREFRESH Note before signing :1}                          Medical Decision Making Amount and/or Complexity of Data Reviewed Labs: ordered.  Risk OTC drugs. Prescription drug management.   ***  {Document critical care time when appropriate:1} {Document review of labs and clinical decision tools ie heart score, Chads2Vasc2 etc:1}  {Document your independent review of radiology images, and any outside records:1} {Document your discussion with family members, caretakers, and with consultants:1} {Document social determinants of health affecting pt's care:1} {Document your decision making why or why not admission, treatments were needed:1} Final Clinical Impression(s) / ED Diagnoses Final diagnoses:  None    Rx / DC Orders ED Discharge Orders     None

## 2023-02-01 NOTE — ED Notes (Signed)
Pt ambulated to the bathroom to void.   

## 2023-02-01 NOTE — Discharge Instructions (Signed)
You are seen today for back pain and fever.  You tested positive for COVID-19.  Quarantine per [employer] guidelines.  Check the website for most up-to-date guidelines.  Take Tylenol or ibuprofen as needed for your pain.  Make sure that you are staying hydrated.

## 2023-03-02 ENCOUNTER — Ambulatory Visit: Payer: Federal, State, Local not specified - PPO | Admitting: Neurology

## 2023-03-02 ENCOUNTER — Encounter: Payer: Self-pay | Admitting: Neurology

## 2023-03-17 ENCOUNTER — Ambulatory Visit: Payer: Federal, State, Local not specified - PPO | Admitting: Neurology
